# Patient Record
Sex: Male | Born: 1970 | Race: White | Hispanic: No | Smoking: Former smoker
Health system: Southern US, Community
[De-identification: ages and names within clinical notes are randomized; demographics above are authoritative.]

## PROBLEM LIST (undated history)

## (undated) DIAGNOSIS — R531 Weakness: Secondary | ICD-10-CM

## (undated) DIAGNOSIS — K219 Gastro-esophageal reflux disease without esophagitis: Secondary | ICD-10-CM

## (undated) DIAGNOSIS — G2581 Restless legs syndrome: Secondary | ICD-10-CM

## (undated) DIAGNOSIS — I10 Essential (primary) hypertension: Secondary | ICD-10-CM

## (undated) DIAGNOSIS — G4730 Sleep apnea, unspecified: Secondary | ICD-10-CM

## (undated) DIAGNOSIS — R51 Headache: Secondary | ICD-10-CM

## (undated) DIAGNOSIS — F419 Anxiety disorder, unspecified: Secondary | ICD-10-CM

## (undated) DIAGNOSIS — F32A Depression, unspecified: Secondary | ICD-10-CM

## (undated) DIAGNOSIS — F329 Major depressive disorder, single episode, unspecified: Secondary | ICD-10-CM

## (undated) DIAGNOSIS — N400 Enlarged prostate without lower urinary tract symptoms: Secondary | ICD-10-CM

## (undated) DIAGNOSIS — G709 Myoneural disorder, unspecified: Secondary | ICD-10-CM

## (undated) DIAGNOSIS — M549 Dorsalgia, unspecified: Secondary | ICD-10-CM

## (undated) DIAGNOSIS — G4700 Insomnia, unspecified: Secondary | ICD-10-CM

## (undated) HISTORY — PX: NASAL SINUS SURGERY: SHX719

## (undated) HISTORY — DX: Sleep apnea, unspecified: G47.30

## (undated) HISTORY — PX: SPINE SURGERY: SHX786

## (undated) HISTORY — PX: CHOLECYSTECTOMY: SHX55

## (undated) HISTORY — DX: Myoneural disorder, unspecified: G70.9

## (undated) HISTORY — PX: BACK SURGERY: SHX140

## (undated) HISTORY — DX: Dorsalgia, unspecified: M54.9

## (undated) HISTORY — DX: Insomnia, unspecified: G47.00

## (undated) HISTORY — PX: FINGER FRACTURE SURGERY: SHX638

---

## 2002-08-13 ENCOUNTER — Emergency Department (HOSPITAL_COMMUNITY): Admission: EM | Admit: 2002-08-13 | Discharge: 2002-08-13 | Payer: Self-pay | Admitting: *Deleted

## 2002-08-13 ENCOUNTER — Encounter: Payer: Self-pay | Admitting: Emergency Medicine

## 2003-05-25 ENCOUNTER — Encounter (INDEPENDENT_AMBULATORY_CARE_PROVIDER_SITE_OTHER): Payer: Self-pay | Admitting: *Deleted

## 2003-05-25 ENCOUNTER — Ambulatory Visit (HOSPITAL_COMMUNITY): Admission: RE | Admit: 2003-05-25 | Discharge: 2003-05-25 | Payer: Self-pay | Admitting: Otolaryngology

## 2003-05-25 ENCOUNTER — Ambulatory Visit (HOSPITAL_BASED_OUTPATIENT_CLINIC_OR_DEPARTMENT_OTHER): Admission: RE | Admit: 2003-05-25 | Discharge: 2003-05-25 | Payer: Self-pay | Admitting: Otolaryngology

## 2004-10-10 ENCOUNTER — Encounter: Admission: RE | Admit: 2004-10-10 | Discharge: 2004-10-10 | Payer: Self-pay | Admitting: Family Medicine

## 2004-11-27 ENCOUNTER — Encounter (INDEPENDENT_AMBULATORY_CARE_PROVIDER_SITE_OTHER): Payer: Self-pay | Admitting: *Deleted

## 2004-11-27 ENCOUNTER — Ambulatory Visit (HOSPITAL_COMMUNITY): Admission: RE | Admit: 2004-11-27 | Discharge: 2004-11-27 | Payer: Self-pay | Admitting: General Surgery

## 2008-11-11 ENCOUNTER — Emergency Department (HOSPITAL_COMMUNITY): Admission: EM | Admit: 2008-11-11 | Discharge: 2008-11-11 | Payer: Self-pay | Admitting: Emergency Medicine

## 2008-11-12 ENCOUNTER — Ambulatory Visit (HOSPITAL_COMMUNITY): Admission: RE | Admit: 2008-11-12 | Discharge: 2008-11-12 | Payer: Self-pay | Admitting: Internal Medicine

## 2008-11-29 ENCOUNTER — Ambulatory Visit (HOSPITAL_COMMUNITY): Admission: RE | Admit: 2008-11-29 | Discharge: 2008-11-30 | Payer: Self-pay | Admitting: Neurological Surgery

## 2009-08-15 ENCOUNTER — Emergency Department: Payer: Self-pay | Admitting: Emergency Medicine

## 2010-09-11 LAB — CBC
HCT: 48.8 % (ref 39.0–52.0)
Platelets: 205 10*3/uL (ref 150–400)
RDW: 13 % (ref 11.5–15.5)
WBC: 10.4 10*3/uL (ref 4.0–10.5)

## 2010-10-17 NOTE — Op Note (Signed)
[name], [name]              ACCOUNT NO.:  [id number]   MEDICAL RECORD NO.:  [id number]          PATIENT TYPE:  OIB   LOCATION:  3534                         FACILITY:  [facility]   PHYSICIAN:  Stefani Dama, M.D.  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   PROCEDURE:  METRx microdiskectomy L5-S1 right with operating microscope,  microdissection technique.   SURGEON:  Stefani Dama, MD   FIRST ASSISTANT:  Danae Orleans. Venetia Maxon, MD   ANESTHESIA:  General endotracheal.   INDICATIONS:  Chad Foster is a 40 year old individual who has had  significant and severe right buttock and leg pain for the past 2 months'  time.  Despite efforts at conservative management with the passage of  time, the pain has continued to worsen.  He has a large extruded  fragment of disk at L5-S1 on the right side.  After being seen last  week, surgery was discussed and the patient is now being taken to the  operating room to undergo microendoscopic diskectomy using METRx  technique.   PROCEDURE:  The patient was brought to the operating room supine on the  stretcher.  After smooth induction of general endotracheal anesthesia,  he was turned prone.  The back was prepped with alcohol and DuraPrep and  draped in sterile fashion.  Fluoroscopic guidance was used to localize  the laminar arch and the interlaminar space at L5-S1 on the right side.  A K-wire was passed down to the laminar arch of L5 and then using a  winding technique, a series of dilators was passed over this after a  small incision was created in the skin.  The skin had been infiltrated  with 10 mL of lidocaine mixed 50:50 with 0.5% Marcaine.  Once all the  dilation had been performed, an 18-mm wide cannula measuring 5 cm in  depth was placed down into the interlaminar space on the right side of  the L5-S1 space.  The inner tubes were removed and the operating  microscope was brought into the field.  Through this aperture then the  superficial fascia and muscle overlying the interlaminar space was  cleared with a monopolar cautery and an inferior margin lamina of L5 out  the medial wall of facet was taken down using a high-speed drill with  2.2 mm dissecting tool.  As dissection continued the yellow ligament was  taken up and the underlying dural tube was identified.  Then by working  on the lateral aspect of the dural tube, there was noted to be a  substantial mass just ventral to it that was bowing the takeoff of the  S1 nerve root significantly.  Gently using a microdissection technique,  Dr. Venetia Maxon could retract the dural tube while I worked on the lateral  aspect and underneath the nerve root carefully protecting it,  identifying ultimately a  large fragment of disk, this was removed as a  singular piece and then 2 other smaller fragments of disk were  identified also.  Further exploration yielded some small bits of disk  material; however, with the dissection being directly over the disk  space, the disk itself had no obvious tears in the posterior  longitudinal ligament.  Further exploration yielded that there was good  decompression of S1 nerve root, some epidural venous bleeding, which was  controlled with bipolar cautery, but no other fragments of disk.  With  this the  procedure was completed by irrigating the area copiously with antibiotic  irrigating solution and then removing the tube and closing the fascia  with a singular 3-0 Vicryl suture and the subcuticular skin with 3-0  Vicryl suture.  The patient tolerated the procedure well, was returned  to recovery room in stable condition.      Stefani Dama, M.D.   Electronically Signed     HJE/MEDQ  D:  11/29/2008  T:  11/30/2008  Job:  [id number]

## 2010-10-20 NOTE — H&P (Signed)
NAME:  Chad Foster, Chad Foster                        ACCOUNT NO.:  [id number]   MEDICAL RECORD NO.:  [id number]                   PATIENT TYPE:  AMB   LOCATION:  DSC                                  FACILITY:  [facility]   PHYSICIAN:  Hermelinda Medicus, M.D.                DATE OF BIRTH:  September 24, 1970   DATE OF ADMISSION:  05/25/2003  DATE OF DISCHARGE:                                HISTORY & PHYSICAL   HISTORY OF PRESENT ILLNESS:  This patient is a 40 year old male who has had  persistent sinus difficulties in the past.  He is working in a [employer] where he is breathing a considerable amount of airborne plastic  dust, and he has had considerable problems for several years.  He has been  on Augmentin XR.  He has been on Cipro.  He has used Afrin.  Used other  nasal sprays trying to improve his nasal airway, but not so much because he  has so much of a septal deviation, it is moderately deviated, but because of  turbinate hypertrophy.  He continues to have sinus problems, as well as ear  problems and serous otitis, and responded reasonably poorly under care with  Allegra D, Z-Pak, and still has sinus pressure especially in his maxillary  and frontal region.  After treatment, a CAT scan was carried out showing an  acute pansinusitis.  Primarily, his frontal on the right with ethmoid on the  right, both maxillaries, and both sphenoid sinuses are showing fluid level  or almost complete opacification.  With this repetitive and failed  treatment, he now enters for functional endoscopic sinus surgery of these  sinuses.   PAST MEDICAL HISTORY:  Quite unremarkable.  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   MEDICATIONS:  His only medication is Paxil.   REVIEW OF SYSTEMS:  Cardiac, pulmonary, neuro all within normal limits.  He  does show a considerable amount of pain especially in the left side of his  face with pressure-type symptoms.   ALLERGIES:  No known drug  allergies.   PHYSICAL EXAMINATION:  VITAL SIGNS:  His blood pressure is 139/97, pulse is  101, temperature 97.3, weight 108.  HEENT:  His ears are clear.  The tympanic membranes are clear, as are the  external ear canals.  The nose shows congestion of the turbinates.  Septum  shows a mild septal deviation to the left.  There is some drainage even on  antibiotics with last of which was Zithromax, and he continues to have post-  nasal drainage of a cloudy type material.  Larynx is clear.  True cords,  false cords, epiglottis, base of tongue are clear of any ulceration or mass.  True cord mobility, gag reflex, __________, EOMs, and facial nerve are all  symmetrical.  Oral cavity is clear.  No ulceration or mass.  NECK:  Free of any  thyromegaly, cervical adenopathy, or mass.  CHEST:  Clear, no wheezes, rhonchi, or rales.  CARDIOVASCULAR:  No murmurs, thrills, heaves, or gallops.  ABDOMEN:  Unremarkable.  EXTREMITIES:  Unremarkable except for the previous finger trauma.   INITIAL DIAGNOSES:  1. Right frontal ethmoid, bilateral maxillary, bilateral sphenoid sinusitis     with turbinate hypertrophy.  2. 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                                                Hermelinda Medicus, M.D.    JC/MEDQ  D:  05/25/2003  T:  05/25/2003  Job:  [id number]   cc:   Vale Haven. Andrey Campanile, M.D.  [address]  [place]  [place] [address]  Fax: [phone]

## 2010-10-20 NOTE — Op Note (Signed)
NAMEPASCO, MARCHITTO                        ACCOUNT NO.:  [id number]   MEDICAL RECORD NO.:  [id number]                   PATIENT TYPE:  AMB   LOCATION:  DSC                                  FACILITY:  [facility]   PHYSICIAN:  Hermelinda Medicus, M.D.                DATE OF BIRTH:  1970-07-29   DATE OF PROCEDURE:  05/25/2003  DATE OF DISCHARGE:                                 OPERATIVE REPORT   PREOPERATIVE DIAGNOSIS:  Bilateral sphenoid, bilateral maxillary, right  frontal ethmoid sinusitis with turbinate hypertrophy.   POSTOPERATIVE DIAGNOSIS:  Bilateral sphenoid, bilateral maxillary, right  frontal ethmoid sinusitis with turbinate hypertrophy.   PROCEDURE:  Functional endoscopic sinus surgery, bilateral sphenoidotomy,  bilateral maxillary sinus ostial enlargement with left antrostomy, right  frontal ethmoidectomy, reduction of turbinates, culture of left maxillary  and left sphenoid sinus anaerobic and aerobic.   SURGEON:  Hermelinda Medicus, M.D.   ANESTHESIA:  Local with MAC standby with Germaine Pomfret, M.D.   DESCRIPTION OF PROCEDURE:  The patient was placed in the supine position.  Under local anesthesia, was prepped and draped in the usual sterile fashion  using the usual head drape and the usual facial prep.  The patient's  anesthesia was instilled using 200 mg of topical cocaine and the 1%  Xylocaine with epinephrine was injected also bilaterally.  Once the  anesthesia was instilled, the sphenoid sinuses were noted immediately where  we had some purulent drainage draining from both the right and the left  sinus, the left seemingly more severe.  We followed this drainage using the  0 degree scope and then opened the sphenoid sinus natural ostium to three to  four times its usual size, suctioned the sphenoid sinus, cultured this for  anaerobic and aerobic, and then moved to the right side.  Again using the  scope, we located this drainage point, located the natural ostium, used  the  angled [employer] and increased the size approximately three to four  times.  Again, we suctioned the sinus without traumatizing the sinus mucous  membrane or the natural ostium.  With this completed, we then approached the  left maxillary sinus which is where we found the natural ostium. We  suctioned a considerable amount of pus from the sinus.  There was so much  debris and polypoid change.  We removed the polypoid debris from this left  side and then did an antrostomy after reducing the inferior turbinate.  We  did the antrostomy, removed thickened membrane, and polypoid material and  cystic material from the sinus from this approach also, suctioned the sinus  completely as well and made sure it was well ventilated.  The left natural  ostium, we made approximately five times its normal size and we used the 0  and 70 degree scopes in this case.  At that point, the right side was then  approached and the middle turbinate was  pushed medial and the ethmoid sinus  was showing some polypoid change. We then entered this ethmoid sinus using  the 0 degree scope and the upbiting and straight [employer] and  removed a considerable amount of mucopurulent debris as well as cystic  debris as well as polypoid debris from this ethmoid sinus.  We then could  see the ceiling of the sinus. We then could look with a 70 degree scope up  toward the frontal sinus and we could see the natural frontal sinus ostium  up into the sinus and we suctioned the material that was blocking this  sinus.  The most superior part of the frontal sinus was in better condition.  Once the ethmoid sinus was cleared, we then approached the maxillary sinus  again using the 0 and 70 degree scope and the curved suction finding the  natural ostium.  Sidebiter forceps was used to increase this approximately  five times its normal size and then suctioned a considerable amount of  debris from this sinus also, most of  which was mucoid in nature.  Once this  was completed, we suctioned the oral cavity, posterior nasopharynx, checked  all the sinuses, and then placed Gelfilm in the ethmoid on the right to push  the medial middle turbinates medial on both sides, and then placed a  Maricell pack on both sides using a 10 cm pack.  The patient tolerated the  procedure very well.  His vision is excellent.  No evidence of any  postoperative difficulty.  The patient had no undue pain and was reasonably  comfortable and will be seen following tomorrow, then one week, three weeks,  six weeks, six months, and one year.                                               Hermelinda Medicus, M.D.    JC/MEDQ  D:  05/25/2003  T:  05/25/2003  Job:  [id number]   cc:   Vale Haven. Andrey Campanile, M.D.  [address]  [place]  [place] [address]  Fax: [phone]

## 2010-10-20 NOTE — Op Note (Signed)
Foster, Chad              ACCOUNT NO.:  [id number]   MEDICAL RECORD NO.:  [id number]          PATIENT TYPE:  AMB   LOCATION:  DAY                          FACILITY:  [facility]   PHYSICIAN:  Ollen Gross. Vernell Morgans, M.D. DATE OF BIRTH:  01-22-1971   DATE OF PROCEDURE:  11/27/2004  DATE OF DISCHARGE:                                 OPERATIVE REPORT   PREOPERATIVE DIAGNOSIS:  Gallstones.   POSTOPERATIVE DIAGNOSIS:  Gallstones.   PROCEDURE:  Laparoscopic cholecystectomy with intraoperative cholangiogram.   SURGEON:  Ollen Gross. Carolynne Edouard, M.D.   ASSISTANT:  Anselm Pancoast. Zachery Dakins, M.D.   ANESTHESIA:  General endotracheal.   DESCRIPTION OF PROCEDURE:  After informed consent was obtained, the patient  was brought to the operating room, placed in a supine position on the table.  After adequate induction of general anesthesia, the patient's abdomen was  prepped with Betadine and draped in the usual sterile manner. The area below  the umbilicus was infiltrated with 0.25% Marcaine. A small incision was made  with a 15 blade knife. This incision was carried down through the  subcutaneous tissues bluntly with a hemostat and [employer] retractors until  the linea alba was identified. The linea alba was incised with a 15 blade  knife and each side was grasped with Kocher clamps and elevated anteriorly.  The preperitoneal space was then probed bluntly with a hemostat until the  peritoneum was opened and access was gained to the abdominal cavity.  A #0  Vicryl pursestring stitch was placed in the fascia surrounding the opening,  a Hasson cannula was placed through the opening and anchored in place with  the previously placed Vicryl pursestring stitch. The abdomen was then  insufflated with carbon dioxide without difficulty. The patient was placed  in the head-up position and rotated slightly with the right side up. Next  the laparoscope was inserted through the Hasson cannula and the right upper  quadrant  was inspected. The dome of the gallbladder and liver were readily  identified. Next, the epigastric region was infiltrated with 0.25% Marcaine,  a small incision was made with a 15 blade knife and a 10 mm port was placed  bluntly through this incision into the abdominal cavity under direct vision.  Sites were then chosen laterally on the right side of the abdomen for  placement of 5 mm ports. Each of these areas was infiltrated with 0.25%  Marcaine. Small stab incisions were made with a 15 blade knife and 5 mm  ports were placed bluntly through these incisions into the abdominal cavity  under direct vision. A blunt grasper was placed through the lateral most 5  mm port and used to grasp the dome of the gallbladder and elevate it  anteriorly and superiorly. Another blunt grasper was placed through the  other 5 mm port and used to retract on the body and neck of the gallbladder.  A dissector was placed through the epigastric port and using the  electrocautery the peritoneal reflection at the gallbladder neck was opened.  Blunt dissection was then carried out in this area until the gallbladder  neck cystic duct junction was readily identified and a good window was  created. A single clip was placed on the gallbladder neck, a small ductotomy  was made just below the clip with the laparoscopic scissors. A 14 gauge  Angiocath was then placed percutaneously through the anterior abdominal wall  under direct vision. The Reddick cholangiogram catheter was placed through  the Angiocath and flushed. The Reddick catheter was then placed within the  cystic duct and anchored in place with a clip. A cholangiogram was obtained  that showed no filling defects, good length on the cystic duct and good  emptying into the duodenum. The anchoring clip and catheters were then  removed from the patient. Three clips were placed proximally on the cystic  duct and the duct was divided between the two sets of clips.  Posterior to  this, the cystic artery was identified and again dissected bluntly in a  circumferential manner until a good window was created. Two clips were  placed proximally and one distally on the artery and the artery was divided  between the two. Next a laparoscopic hook cautery device was used to  separate the gallbladder from the liver bed. Prior to completely detaching  the gallbladder from the liver bed liver, the liver bed was inspected and  several small bleeding points were coagulated with the electrocautery until  the area was completely hemostatic. The gallbladder was then detached the  rest of the way from the liver bed without difficulty. A laparoscopic bag  was placed through the epigastric port, the gallbladder was placed within  the bag and the bag was sealed. The abdomen was then irrigated with copious  amounts of saline until the effluent was clear. The laparoscope was then  moved to the epigastric port and a gallbladder grasper was placed through  the Hasson cannula and used to grasp the opening of the bag. The bag with  the gallbladder was then removed through the infraumbilical port without  difficulty. The fascial defect was closed with the previous previously  placed Vicryl pursestring stitch as well as with another interrupted #0  Vicryl stitch. The rest of the ports were then removed under direct vision  and were all found to be hemostatic. The gas was allowed to escape. The skin  incisions were closed with interrupted 4-0 Monocryl subcuticular stitches.  Benzoin and Steri-Strips were applied. The patient tolerated the procedure  well. At the end of the case, all needle, sponge and instrument counts were  correct. The patient was then awakened and taken to the recovery in stable  condition.       PST/MEDQ  D:  11/28/2004  T:  11/28/2004  Job:  [id number]

## 2011-02-21 ENCOUNTER — Emergency Department (HOSPITAL_COMMUNITY)
Admission: EM | Admit: 2011-02-21 | Discharge: 2011-02-21 | Disposition: A | Discharge status: Home / Self Care | Payer: Commercial Managed Care - PPO | Attending: Emergency Medicine | Admitting: Emergency Medicine

## 2011-02-21 ENCOUNTER — Encounter: Payer: Self-pay | Admitting: *Deleted

## 2011-02-21 ENCOUNTER — Emergency Department (HOSPITAL_COMMUNITY): Payer: Commercial Managed Care - PPO

## 2011-02-21 DIAGNOSIS — I10 Essential (primary) hypertension: Secondary | ICD-10-CM | POA: Insufficient documentation

## 2011-02-21 DIAGNOSIS — F3289 Other specified depressive episodes: Secondary | ICD-10-CM | POA: Insufficient documentation

## 2011-02-21 DIAGNOSIS — M79609 Pain in unspecified limb: Secondary | ICD-10-CM | POA: Insufficient documentation

## 2011-02-21 DIAGNOSIS — F17200 Nicotine dependence, unspecified, uncomplicated: Secondary | ICD-10-CM | POA: Insufficient documentation

## 2011-02-21 DIAGNOSIS — F329 Major depressive disorder, single episode, unspecified: Secondary | ICD-10-CM | POA: Insufficient documentation

## 2011-02-21 DIAGNOSIS — M79671 Pain in right foot: Secondary | ICD-10-CM

## 2011-02-21 DIAGNOSIS — F411 Generalized anxiety disorder: Secondary | ICD-10-CM | POA: Insufficient documentation

## 2011-02-21 HISTORY — DX: Major depressive disorder, single episode, unspecified: F32.9

## 2011-02-21 HISTORY — DX: Anxiety disorder, unspecified: F41.9

## 2011-02-21 HISTORY — DX: Depression, unspecified: F32.A

## 2011-02-21 HISTORY — DX: Essential (primary) hypertension: I10

## 2011-02-21 MED ORDER — ONDANSETRON 8 MG PO TBDP
8.0000 mg | ORAL_TABLET | Freq: Once | ORAL | Status: AC
  Administered 2011-02-21: 8 mg via ORAL
  Filled 2011-02-21: qty 1

## 2011-02-21 MED ORDER — OXYCODONE-ACETAMINOPHEN 5-325 MG PO TABS: ORAL_TABLET | ORAL | Status: DC

## 2011-02-21 MED ORDER — CELECOXIB 200 MG PO CAPS: 200.0000 mg | ORAL_CAPSULE | Freq: Two times a day (BID) | ORAL | Status: AC

## 2011-02-21 MED ORDER — HYDROMORPHONE HCL 1 MG/ML IJ SOLN
1.0000 mg | Freq: Once | INTRAMUSCULAR | Status: AC
  Administered 2011-02-21: 1 mg via INTRAMUSCULAR
  Filled 2011-02-21: qty 1
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
## 2011-02-21 NOTE — ED Notes (Signed)
Person(male) with pt advised me that the pt needed pain medications and that Morphine was not going to work.  Individual with pt is very rude.

## 2011-02-21 NOTE — ED Notes (Signed)
Family at bedside. Patient states that he needs pain medicine what was given did not help him at all. RN Leonette Most aware.

## 2011-02-21 NOTE — ED Notes (Signed)
Pt fitted for crutches and fx shoe, pt given crutch walking instruction and verbalized an understanding of instructions.

## 2011-03-02 NOTE — ED Provider Notes (Signed)
Medical screening examination/treatment/procedure(s) were performed by non-physician practitioner and as supervising physician I was immediately available for consultation/collaboration.   Geoffery Lyons, MD 03/02/11 [id number]

## 2011-12-31 ENCOUNTER — Ambulatory Visit (INDEPENDENT_AMBULATORY_CARE_PROVIDER_SITE_OTHER): Payer: Commercial Managed Care - PPO | Admitting: Psychology

## 2011-12-31 DIAGNOSIS — F329 Major depressive disorder, single episode, unspecified: Secondary | ICD-10-CM

## 2012-01-22 ENCOUNTER — Ambulatory Visit (INDEPENDENT_AMBULATORY_CARE_PROVIDER_SITE_OTHER): Payer: Commercial Managed Care - PPO | Admitting: Psychology

## 2012-01-22 DIAGNOSIS — F329 Major depressive disorder, single episode, unspecified: Secondary | ICD-10-CM

## 2012-02-05 ENCOUNTER — Encounter (HOSPITAL_COMMUNITY): Payer: Self-pay | Admitting: Psychology

## 2012-02-05 ENCOUNTER — Ambulatory Visit (HOSPITAL_COMMUNITY): Payer: Self-pay | Admitting: Psychology

## 2012-02-05 NOTE — Progress Notes (Signed)
Patient:  Chad Foster   DOB: [birth year]  MR Number: [id number]  Location: BEHAVIORAL [facility] [address] [place] [address] Dept: [phone]  Start: 1 PM End: 2 PM  Provider/Observer:     Hershal Coria PSYD  Chief Complaint:      Chief Complaint  Patient presents with  . Depression  . Anxiety  . Stress    Reason For Service:     The patient was referred by Dr. Andrey Campanile because of issues related to depression after being out of work due to severe back pain. The patient reports that he had disc rupture and subsequent surgery in L5/S1. The patient recuperated from this for the most part and try to go back to work. He began developing more and more pain and saw Dr. Danielle Dess again. He has been having increasing pain difficulties and has missed a lot of work and been limited as far as his physical activity. He has also received spinal injections as well. The patient reports that there is no other surgery possibility. The patient has had issues with depression to some degree of the past 10 years and has been treated with both Paxil and Effexor in the past. The patient did have a time where he stopped taking these for one week and had a significant setback from his depression symptoms in his back to 1950 mg of SSRI.   Interventions Strategy:  Cognitive/behavioral psychotherapeutic interventions  Participation Level:   Active  Participation Quality:  Appropriate      Behavioral Observation:  Well Groomed, Alert, and Appropriate.   Current Psychosocial Factors: The patient reports he is continuing to struggle significantly with not been able to work adequately and the limitations to his life. Significant symptoms of depression including feelings of helplessness and hopelessness continued to negative bili impact on his interactions with his family.  Content of Session:   Reviewed current symptoms and  continue to work on building better coping skills and strategies around issues of his symptoms of depression.  Current Status:   The patient continues to report significant symptoms of depression including feelings of helplessness/hopelessness, sadness, social withdrawal, changes in appetite and sleep, as well as significant and recurrent severe pain.  Patient Progress:   Stable  Goals Addressed Today:    Goals addressed today have to do with increasing his coping skills and strategies around issues of recurrent depression and better dealing with when he is currently going through.  Impression/Diagnosis:   At this point, the patient does have some significant residual pain problems from his ruptured disc. He is having difficulty maintaining his work and is struggling financially and dealing with not being able to work like he had been. At this point, there does appear to be significant symptoms of depression.   Diagnosis:    Axis I:  1. Depression         Axis II: No diagnosis
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
## 2012-02-15 ENCOUNTER — Ambulatory Visit (INDEPENDENT_AMBULATORY_CARE_PROVIDER_SITE_OTHER): Payer: Commercial Managed Care - PPO | Admitting: Psychology

## 2012-02-15 DIAGNOSIS — F3289 Other specified depressive episodes: Secondary | ICD-10-CM

## 2012-02-15 DIAGNOSIS — F329 Major depressive disorder, single episode, unspecified: Secondary | ICD-10-CM

## 2012-02-15 NOTE — Progress Notes (Signed)
Patient:  Chad Foster   DOB: [birth year]  MR Number: [id number]  Location: BEHAVIORAL [facility] [address] [place] [address] Dept: [phone]  Start: 1 PM End: 2 PM  Provider/Observer:     Hershal Coria PSYD  Chief Complaint:      Chief Complaint  Patient presents with  . Depression  . Stress  . Other    Significant pain symptoms    Reason For Service:     The patient was referred by Dr. Andrey Campanile because of issues related to depression after being out of work due to severe back pain. The patient reports that he had disc rupture and subsequent surgery in L5/S1. The patient recuperated from this for the most part and try to go back to work. He began developing more and more pain and saw Dr. Danielle Dess again. He has been having increasing pain difficulties and has missed a lot of work and been limited as far as his physical activity. He has also received spinal injections as well. The patient reports that there is no other surgery possibility. The patient has had issues with depression to some degree of the past 10 years and has been treated with both Paxil and Effexor in the past. The patient did have a time where he stopped taking these for one week and had a significant setback from his depression symptoms and is back taking the SSRI medications again.    Interventions Strategy:  Cognitive/behavioral psychotherapeutic interventions  Participation Level:   Active  Participation Quality:  Appropriate      Behavioral Observation:  Well Groomed, Alert, and Appropriate.   Current Psychosocial Factors: The patient describes continued family stress and difficulty that are causing him a great deal of stress. The patient reports that his wife has told him that she was told by one of her coworkers who is a physician that she may be pregnant even though she had a hysterectomy years ago.  This created some  conflict as he is not feeling capable alert ready to deal with his wife being pregnant and having another child. He has not been able to work and is overwhelmed with all of how he would handle been unable to take care of this child.  Content of Session:   Reviewed current symptoms and continue to work on building better coping skills and strategies around issues of his symptoms of depression.  Current Status:   The patient continues to report significant symptoms of depression including feelings of helplessness/hopelessness, sadness, social withdrawal, changes in appetite and sleep, as well as significant and recurrent severe pain.  Patient Progress:   Stable  Goals Addressed Today:    Goals addressed today have to do with increasing his coping skills and strategies around issues of recurrent depression and better dealing with when he is currently going through.  Impression/Diagnosis:   At this point, the patient does have some significant residual pain problems from his ruptured disc. He is having difficulty maintaining his work and is struggling financially and dealing with not being able to work like he had been. At this point, there does appear to be significant symptoms of depression.   Diagnosis:    Axis I:  1. Depression         Axis II: No diagnosis

## 2012-02-29 ENCOUNTER — Ambulatory Visit (INDEPENDENT_AMBULATORY_CARE_PROVIDER_SITE_OTHER): Payer: Commercial Managed Care - PPO | Admitting: Psychology

## 2012-02-29 DIAGNOSIS — F411 Generalized anxiety disorder: Secondary | ICD-10-CM

## 2012-02-29 DIAGNOSIS — F419 Anxiety disorder, unspecified: Secondary | ICD-10-CM

## 2012-02-29 DIAGNOSIS — F329 Major depressive disorder, single episode, unspecified: Secondary | ICD-10-CM

## 2012-03-04 ENCOUNTER — Encounter (HOSPITAL_COMMUNITY): Payer: Self-pay | Admitting: Psychology

## 2012-03-04 NOTE — Progress Notes (Signed)
Patient:  Chad Foster   DOB: 08-06-1970  MR Number: [id number]  Location: BEHAVIORAL [facility] [address] [place] [address] Dept: [phone]  Start: 1 PM End: 2 PM  Provider/Observer:     Hershal Coria PSYD  Chief Complaint:      Chief Complaint  Patient presents with  . Depression  . Anxiety    Reason For Service:     The patient was referred by Dr. Andrey Campanile because of issues related to depression after being out of work due to severe back pain. The patient reports that he had disc rupture and subsequent surgery in L5/S1. The patient recuperated from this for the most part and try to go back to work. He began developing more and more pain and saw Dr. Danielle Dess again. He has been having increasing pain difficulties and has missed a lot of work and been limited as far as his physical activity. He has also received spinal injections as well. The patient reports that there is no other surgery possibility. The patient has had issues with depression to some degree of the past 10 years and has been treated with both Paxil and Effexor in the past. The patient did have a time where he stopped taking these for one week and had a significant setback from his depression symptoms and is back taking the SSRI medications again.    Interventions Strategy:  Cognitive/behavioral psychotherapeutic interventions  Participation Level:   Active  Participation Quality:  Appropriate      Behavioral Observation:  Well Groomed, Alert, and Appropriate.   Current Psychosocial Factors: The patient reports that the situation with his wife's possible pregnancy came and went without and fair and there are no were concerns about whether or not she is pregnant. We did use this as an example to work on Pharmacologist and strategies..  Content of Session:   Reviewed current symptoms and continue to work on building better coping  skills and strategies around issues of his symptoms of depression.  Current Status:   The patient continues to report significant symptoms of depression including feelings of helplessness/hopelessness, sadness, social withdrawal, changes in appetite and sleep, as well as significant and recurrent severe pain.  Patient Progress:   Stable  Goals Addressed Today:    Goals addressed today have to do with increasing his coping skills and strategies around issues of recurrent depression and better dealing with when he is currently going through.  Impression/Diagnosis:   At this point, the patient does have some significant residual pain problems from his ruptured disc. He is having difficulty maintaining his work and is struggling financially and dealing with not being able to work like he had been. At this point, there does appear to be significant symptoms of depression.   Diagnosis:    Axis I:  1. Depressive disorder   2. Anxiety         Axis II: No diagnosis

## 2012-04-02 ENCOUNTER — Ambulatory Visit (HOSPITAL_COMMUNITY): Payer: Self-pay | Admitting: Psychology

## 2012-04-08 ENCOUNTER — Ambulatory Visit (HOSPITAL_COMMUNITY): Payer: Self-pay | Admitting: Psychology

## 2012-04-10 ENCOUNTER — Ambulatory Visit (HOSPITAL_COMMUNITY): Payer: Self-pay | Admitting: Psychology

## 2012-04-16 ENCOUNTER — Ambulatory Visit (HOSPITAL_COMMUNITY): Payer: Self-pay | Admitting: Psychology

## 2012-04-28 ENCOUNTER — Ambulatory Visit (INDEPENDENT_AMBULATORY_CARE_PROVIDER_SITE_OTHER): Payer: Commercial Managed Care - PPO | Admitting: Psychology

## 2012-04-28 DIAGNOSIS — F411 Generalized anxiety disorder: Secondary | ICD-10-CM

## 2012-04-28 DIAGNOSIS — F3289 Other specified depressive episodes: Secondary | ICD-10-CM

## 2012-04-28 DIAGNOSIS — F329 Major depressive disorder, single episode, unspecified: Secondary | ICD-10-CM

## 2012-04-28 DIAGNOSIS — F419 Anxiety disorder, unspecified: Secondary | ICD-10-CM

## 2012-04-30 ENCOUNTER — Ambulatory Visit (HOSPITAL_COMMUNITY)
Admission: RE | Admit: 2012-04-30 | Discharge: 2012-04-30 | Disposition: A | Discharge status: Home / Self Care | Payer: 59 | Source: Ambulatory Visit | Attending: Anesthesiology | Admitting: Anesthesiology

## 2012-04-30 DIAGNOSIS — M545 Low back pain, unspecified: Secondary | ICD-10-CM | POA: Insufficient documentation

## 2012-04-30 DIAGNOSIS — IMO0001 Reserved for inherently not codable concepts without codable children: Secondary | ICD-10-CM | POA: Insufficient documentation
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
## 2012-05-06 ENCOUNTER — Ambulatory Visit (HOSPITAL_COMMUNITY): Payer: Self-pay | Admitting: Physical Therapy

## 2012-05-09 ENCOUNTER — Inpatient Hospital Stay (HOSPITAL_COMMUNITY): Admission: RE | Admit: 2012-05-09 | Payer: Self-pay | Source: Ambulatory Visit

## 2012-05-13 ENCOUNTER — Ambulatory Visit (HOSPITAL_COMMUNITY)
Admission: RE | Admit: 2012-05-13 | Discharge: 2012-05-13 | Disposition: A | Discharge status: Home / Self Care | Payer: 59 | Source: Ambulatory Visit | Attending: Anesthesiology | Admitting: Anesthesiology

## 2012-05-13 DIAGNOSIS — M545 Low back pain, unspecified: Secondary | ICD-10-CM | POA: Insufficient documentation

## 2012-05-13 DIAGNOSIS — IMO0001 Reserved for inherently not codable concepts without codable children: Secondary | ICD-10-CM | POA: Insufficient documentation

## 2012-05-13 NOTE — Progress Notes (Signed)
Physical Therapy Treatment Patient Details  Name: Chad Foster MRN: [id number] Date of Birth: 26-Sep-1970  Today's Date: 05/13/2012 Time: 3086-5784 PT Time Calculation (min): 39 min  Visit#: 2  of 12   Re-eval: 05/30/12 Charges: Therex x 38'   Subjective: Symptoms/Limitations Symptoms: Pt reports HEP compliance. Pain Assessment Currently in Pain?: Yes Pain Score:   4 Pain Location: Back Pain Orientation: Lower   Exercise/Treatments Stretches Active Hamstring Stretch: 3 reps;30 seconds Single Knee to Chest Stretch: 3 reps;30 seconds Piriformis Stretch: 3 reps;30 seconds Supine Ab Set: 5 reps AB Set Limitations: 10" holds Bent Knee Raise: 10 reps Bridge: 10 reps;5 seconds Prone  Other Prone Lumbar Exercises: NMR for multifidus 10x5"  Physical Therapy Assessment and Plan PT Assessment and Plan Clinical Impression Statement: Pt completes therex well with good core contraction after cueing for technique. Began LE stretching and postural strengthening. Pt completes scapular tband exercises with good scapular contraction but poor posture requiring vc's to engage core and improve head posture. Pt reports increase leg pain with lumbar extension which subsided over time. Pt reports 4/10 LBP at end of session. PT Plan: Continue to progress core and postural strengthening and LE stretching per PT POC.     Problem List Patient Active Problem List  Diagnosis  . Lumbago    PT - End of Session Activity Tolerance: Patient tolerated treatment well General Behavior During Session: [facility] for tasks performed Cognition: [facility] for tasks performed  Seth Bake, PTA 05/13/2012, 11:49 AM

## 2012-05-15 ENCOUNTER — Ambulatory Visit (HOSPITAL_COMMUNITY): Payer: Self-pay

## 2012-05-15 ENCOUNTER — Encounter (HOSPITAL_COMMUNITY): Payer: Self-pay | Admitting: Psychology

## 2012-05-15 NOTE — Progress Notes (Signed)
Patient:  Chad Foster   DOB: January 02, 1971  MR Number: [id number]  Location: BEHAVIORAL [facility] [address] [place] [address] Dept: [phone]  Start: 2 PM End: 3 PM  Provider/Observer:     Hershal Coria PSYD  Chief Complaint:      Chief Complaint  Patient presents with  . Anxiety  . Depression    Reason For Service:     The patient was referred by Dr. Andrey Campanile because of issues related to depression after being out of work due to severe back pain. The patient reports that he had disc rupture and subsequent surgery in L5/S1. The patient recuperated from this for the most part and try to go back to work. He began developing more and more pain and saw Dr. Danielle Dess again. He has been having increasing pain difficulties and has missed a lot of work and been limited as far as his physical activity. He has also received spinal injections as well. The patient reports that there is no other surgery possibility. The patient has had issues with depression to some degree of the past 10 years and has been treated with both Paxil and Effexor in the past. The patient did have a time where he stopped taking these for one week and had a significant setback from his depression symptoms and is back taking the SSRI medications again.    Interventions Strategy:  Cognitive/behavioral psychotherapeutic interventions  Participation Level:   Active  Participation Quality:  Appropriate      Behavioral Observation:  Well Groomed, Alert, and Appropriate.   Current Psychosocial Factors: The patient reports that he has been working on issues between he and his wife more and has been doing more things as far as attempting to cope with his current situation. Severe pain that is almost constant remains a major issue but he has been trying to do as much physical activity as possible..  Content of Session:   Reviewed current  symptoms and continue to work on building better coping skills and strategies around issues of his symptoms of depression.  Current Status:   The patient continues to report significant symptoms of depression including feelings of helplessness/hopelessness, sadness, social withdrawal, changes in appetite and sleep, as well as significant and recurrent severe pain.  Patient Progress:   Stable  Goals Addressed Today:    Goals addressed today have to do with increasing his coping skills and strategies around issues of recurrent depression and better dealing with when he is currently going through.  Impression/Diagnosis:   At this point, the patient does have some significant residual pain problems from his ruptured disc. He is having difficulty maintaining his work and is struggling financially and dealing with not being able to work like he had been. At this point, there does appear to be significant symptoms of depression.   Diagnosis:    Axis I:  1. Depressive disorder   2. Anxiety         Axis II: No diagnosis

## 2012-05-19 ENCOUNTER — Ambulatory Visit (HOSPITAL_COMMUNITY): Payer: Self-pay | Admitting: Psychology

## 2012-05-20 ENCOUNTER — Ambulatory Visit (HOSPITAL_COMMUNITY): Payer: Self-pay | Admitting: Physical Therapy

## 2012-05-21 ENCOUNTER — Ambulatory Visit (INDEPENDENT_AMBULATORY_CARE_PROVIDER_SITE_OTHER): Payer: 59 | Admitting: Psychology

## 2012-05-21 DIAGNOSIS — F341 Dysthymic disorder: Secondary | ICD-10-CM

## 2012-05-21 DIAGNOSIS — F419 Anxiety disorder, unspecified: Secondary | ICD-10-CM

## 2012-05-21 DIAGNOSIS — F329 Major depressive disorder, single episode, unspecified: Secondary | ICD-10-CM

## 2012-05-22 ENCOUNTER — Ambulatory Visit (HOSPITAL_COMMUNITY)
Admission: RE | Admit: 2012-05-22 | Discharge: 2012-05-22 | Disposition: A | Discharge status: Home / Self Care | Payer: 59 | Source: Ambulatory Visit | Attending: Anesthesiology | Admitting: Anesthesiology

## 2012-05-22 ENCOUNTER — Encounter (HOSPITAL_COMMUNITY): Payer: Self-pay | Admitting: Psychology

## 2012-05-22 NOTE — Progress Notes (Signed)
Physical Therapy Treatment Patient Details  Name: Chad Foster MRN: [id number] Date of Birth: 28-Apr-1971  Today's Date: 05/22/2012 Time: 1430-1508 PT Time Calculation (min): 38 min Charges: 48' TE Visit#: 3  of 12   Re-eval: 05/30/12   Subjective: Symptoms/Limitations Symptoms: he had to cancel apt last time due to increased pain.  states his pain remains the same about a 3-4/10.  Pain Assessment Currently in Pain?: Yes Pain Score:   4  Precautions/Restrictions     Exercise/Treatments Stretches Active Hamstring Stretch: 3 reps;30 seconds (Bilateral) Piriformis Stretch: 3 reps;30 seconds Standing Heel Raises: 10 reps Functional Squats: 10 reps Scapular Retraction: 10 reps;Theraband Theraband Level (Scapular Retraction): Level 4 (Blue) Row: 10 reps;Theraband Theraband Level (Row): Level 4 (Blue) Shoulder Extension: 10 reps;Theraband Theraband Level (Shoulder Extension): Level 4 (Blue) Shoulder ADduction: Both;10 reps;Theraband Theraband Level (Shoulder Adduction): Level 4 (Blue) Other Standing Lumbar Exercises: Gastroc stretch B 3x30" (slant board) Supine Bent Knee Raise: 15 reps Sidelying Clam: 5 reps (10 sec holds, BLE) Hip Abduction: 10 reps (BLE) Prone  Single Arm Raise: Right;Left;10 reps Straight Leg Raise: 10 reps (BLE) Opposite Arm/Leg Raise: Right arm/Left leg;Left arm/Right leg;5 reps Other Prone Lumbar Exercises: NMR for multifidus and PF 10x10" each Other Prone Lumbar Exercises: B hip IR 2x5  Physical Therapy Assessment and Plan PT Assessment and Plan Clinical Impression Statement: 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  Added LE strengthening activities to help core strength.  Pt continues to improve core coordination and posture. Provided pt w/blue t-band for home use. PT Plan: Continue to progress LE strengthening and core strength.  Add supine clams, dead bug next visit.      Goals    Problem List Patient Active Problem List  Diagnosis  . Lumbago    PT Plan of Care PT Home Exercise Plan: provided pt with blue tband.  Langley Ingalls, PT 05/22/2012, 3:10 PM

## 2012-05-22 NOTE — Progress Notes (Signed)
Patient:  Chad Foster   DOB: [birth year]  MR Number: [id number]  Location: BEHAVIORAL [facility] [address] [place] [address] Dept: [phone]  Start: 2 PM End: 3 PM  Provider/Observer:     Hershal Coria PSYD  Chief Complaint:      Chief Complaint  Patient presents with  . Anxiety  . Depression  . Stress  . Other    Severe chronic pain    Reason For Service:     The patient was referred by Dr. Andrey Campanile because of issues related to depression after being out of work due to severe back pain. The patient reports that he had disc rupture and subsequent surgery in L5/S1. The patient recuperated from this for the most part and try to go back to work. He began developing more and more pain and saw Dr. Danielle Dess again. He has been having increasing pain difficulties and has missed a lot of work and been limited as far as his physical activity. He has also received spinal injections as well. The patient reports that there is no other surgery possibility. The patient has had issues with depression to some degree of the past 10 years and has been treated with both Paxil and Effexor in the past. The patient did have a time where he stopped taking these for one week and had a significant setback from his depression symptoms and is back taking the SSRI medications again.    Interventions Strategy:  Cognitive/behavioral psychotherapeutic interventions  Participation Level:   Active  Participation Quality:  Appropriate      Behavioral Observation:  Well Groomed, Alert, and Appropriate.   Current Psychosocial Factors: The patient reports that he has been able to continue to reduce his pain medications but still is taking. He has seen a pain clinic now in his try to pursue an implanted TENS unit. However, he reports that he has not taken all of the pain medications that have been prescribed to him that there are  times when he does not feel that it is essential that he takes it and is worried about developing any type of dependency. However, because he is been told by his doctors that he needs to "stay ahead of this pain" he is concerned that they will be "mad at him" if he has some of these narcotic pain medications left over the months. I told them that he needs to be honest with them about what is going on and what his concerns are that maybe they can change his prescription to PRN.  Content of Session:   Reviewed current symptoms and continue to work on building better coping skills and strategies around issues of his symptoms of depression.  Current Status:   The patient continues to experience severe pain from nerve root impingement at L5-S1. The patient reports that he is following suggestions to be as physically active as possible but trying to not do more than he could replicate the next day to build up his tolerance.  Patient Progress:   Stable  Goals Addressed Today:    Goals addressed today have to do with increasing his coping skills and strategies around issues of recurrent depression and better dealing with when he is currently going through.  Impression/Diagnosis:   At this point, the patient does have some significant residual pain problems from his ruptured disc. He is having difficulty maintaining his work and is struggling financially and dealing with not  being able to work like he had been. At this point, there does appear to be significant symptoms of depression.   Diagnosis:    Axis I:  1. Depressive disorder   2. Anxiety         Axis II: No diagnosis

## 2012-05-26 ENCOUNTER — Ambulatory Visit (HOSPITAL_COMMUNITY): Payer: Self-pay | Admitting: Physical Therapy

## 2012-05-30 ENCOUNTER — Inpatient Hospital Stay (HOSPITAL_COMMUNITY): Admission: RE | Admit: 2012-05-30 | Payer: Self-pay | Source: Ambulatory Visit | Admitting: Physical Therapy

## 2012-06-19 ENCOUNTER — Encounter (HOSPITAL_COMMUNITY): Payer: Self-pay | Admitting: Pharmacy Technician

## 2012-06-19 NOTE — H&P (Signed)
  NTS SOAP Note  Vital Signs:  Vitals as of: 06/19/2012: Systolic 157: Diastolic 101: Heart Rate 89: Temp 99.21F: Height 40ft 0in: Weight 210Lbs 0 Ounces: Pain Level 2: BMI 28  BMI : 28.48 kg/m2  Subjective: This 42 Years 98 Months old Male presents for of a mass on his chest wall.  Has been present for many years, but recently is increasing in size and causing him discomfort.  Review of Symptoms:  Constitutional:unremarkable   Head:unremarkable    Eyes:  blurred vision bilateral Nose/Mouth/Throat:unremarkable Cardiovascular:  unremarkable   Respiratory:unremarkable   Gastrointestin    heartburn Genitourinary:unremarkable       back pain Skin:unremarkable Hematolgic/Lymphatic:unremarkable     Allergic/Immunologic:unremarkable     Past Medical History:    Reviewed   Past Medical History  Surgical History: back, hip, finger, sinus Medical Problems:  High Blood pressure, chronic back pain Allergies: nkda Medications: dexilant, venlafoxine, xanax, benicar, premipezole, celebrex, oxycodone   Social History:Reviewed  Social History  Preferred Language: English Ethnicity: Not Hispanic / Latino Age: 42 Years 0 Months Marital Status:  M Alcohol: socially Recreational drug(s):  No   Smoking Status: Smoker/Current status unknown reviewed on 06/19/2012 Functional Status reviewed on mm/dd/yyyy ------------------------------------------------ Bathing: Normal Cooking: Normal Dressing: Normal Driving: Normal Eating: Normal Managing Meds: Normal Oral Care: Normal Shopping: Normal Toileting: Normal Transferring: Normal Walking: Normal Cognitive Status reviewed on mm/dd/yyyy ------------------------------------------------ Attention: Normal Decision Making: Normal Language: Normal Memory: Normal Motor: Normal Perception: Normal Problem Solving: Normal Visual and Spatial: Normal   Family History:  Reviewed   Family  History  Is there a family history HY:[id number], dm. HTN    Objective Information: General:  Well appearing, well nourished in no distress.      5cm ovoid, rubbery subcutaneous mass, right midclavicular line, chest wall.  Mobile. Heart:  RRR, no murmur Lungs:    CTA bilaterally, no wheezes, rhonchi, rales.  Breathing unlabored.  Assessment:Neoplasm, chest wall  Diagnosis &amp; Procedure:    Plan:Scheduled for excision of neoplasm, chest wall on 06/23/12.   Patient Education:Alternative treatments to surgery were discussed with patient (and family).  Risks and benefits  of procedure were fully explained to the patient (and family) who gave informed consent. Patient/family questions were addressed.  Follow-up:Pending Surgery

## 2012-06-20 ENCOUNTER — Encounter (HOSPITAL_COMMUNITY): Payer: Self-pay

## 2012-06-20 ENCOUNTER — Other Ambulatory Visit: Payer: Self-pay

## 2012-06-20 ENCOUNTER — Ambulatory Visit (HOSPITAL_COMMUNITY): Payer: Self-pay | Admitting: Psychology

## 2012-06-20 ENCOUNTER — Encounter (HOSPITAL_COMMUNITY)
Admission: RE | Admit: 2012-06-20 | Discharge: 2012-06-20 | Disposition: A | Discharge status: Home / Self Care | Payer: Commercial Managed Care - PPO | Source: Ambulatory Visit | Attending: General Surgery | Admitting: General Surgery

## 2012-06-20 HISTORY — DX: Restless legs syndrome: G25.81

## 2012-06-20 HISTORY — DX: Gastro-esophageal reflux disease without esophagitis: K21.9

## 2012-06-20 LAB — CBC WITH DIFFERENTIAL/PLATELET
Eosinophils Absolute: 0.2 10*3/uL (ref 0.0–0.7)
Hemoglobin: 17.2 g/dL — ABNORMAL HIGH (ref 13.0–17.0)
Lymphocytes Relative: 20 % (ref 12–46)
Lymphs Abs: 1.9 10*3/uL (ref 0.7–4.0)
MCH: 33.6 pg (ref 26.0–34.0)
Monocytes Relative: 7 % (ref 3–12)
Neutro Abs: 6.8 10*3/uL (ref 1.7–7.7)
Neutrophils Relative %: 72 % (ref 43–77)
Platelets: 253 10*3/uL (ref 150–400)
RBC: 5.12 MIL/uL (ref 4.22–5.81)
WBC: 9.5 10*3/uL (ref 4.0–10.5)

## 2012-06-20 LAB — BASIC METABOLIC PANEL
Calcium: 9.6 mg/dL (ref 8.4–10.5)
GFR calc Af Amer: >90 mL/min (ref >90)
GFR calc non Af Amer: >90 mL/min (ref >90)
Glucose, Bld: 126 mg/dL — ABNORMAL HIGH (ref 70–99)
Potassium: 4.4 mEq/L (ref 3.5–5.1)
Sodium: 139 mEq/L (ref 135–145)

## 2012-06-20 LAB — SURGICAL PCR SCREEN: Staphylococcus aureus: NEGATIVE

## 2012-06-20 NOTE — Patient Instructions (Addendum)
    Chad Foster  06/20/2012   Your procedure is scheduled on: 06/23/2012  Report to [facility] at  820  AM.  Call this number if you have problems the morning of surgery: [phone]   Remember:   Do not eat food or drink liquids after midnight.   Take these medicines the morning of surgery with A SIP OF WATER: xanax,celebrex,dexilant,benicar,oxycodone,effexor   Do not wear jewelry, make-up or nail polish.  Do not wear lotions, powders, or perfumes.  Do not shave 48 hours prior to surgery. Men may shave face and neck.  Do not bring valuables to the hospital.  Contacts, dentures or bridgework may not be worn into surgery.  Leave suitcase in the car. After surgery it may be brought to your room.  For patients admitted to the hospital, checkout time is 11:00 AM the day of discharge.   Patients discharged the day of surgery will not be allowed to drive  home.  Name and phone number of your driver: family  Special Instructions: Shower using CHG 2 nights before surgery and the night before surgery.  If you shower the day of surgery use CHG.  Use special wash - you have one bottle of CHG for all showers.  You should use approximately 1/3 of the bottle for each shower.   Please read over the following fact sheets that you were given: Pain Booklet, Coughing and Deep Breathing, MRSA Information, Surgical Site Infection Prevention, Anesthesia Post-op Instructions and Care and Recovery After Surgery PATIENT INSTRUCTIONS POST-ANESTHESIA  IMMEDIATELY FOLLOWING SURGERY:  Do not drive or operate machinery for the first twenty four hours after surgery.  Do not make any important decisions for twenty four hours after surgery or while taking narcotic pain medications or sedatives.  If you develop intractable nausea and vomiting or a severe headache please notify your doctor immediately.  FOLLOW-UP:  Please make an appointment with your surgeon as instructed. You do not need to follow up with anesthesia  unless specifically instructed to do so.  WOUND CARE INSTRUCTIONS (if applicable):  Keep a dry clean dressing on the anesthesia/puncture wound site if there is drainage.  Once the wound has quit draining you may leave it open to air.  Generally you should leave the bandage intact for twenty four hours unless there is drainage.  If the epidural site drains for more than 36-48 hours please call the anesthesia department.  QUESTIONS?:  Please feel free to call your physician or the hospital operator if you have any questions, and they will be happy to assist you.

## 2012-06-23 ENCOUNTER — Encounter (HOSPITAL_COMMUNITY): Payer: Self-pay | Admitting: *Deleted

## 2012-06-23 ENCOUNTER — Ambulatory Visit (HOSPITAL_COMMUNITY)
Admission: RE | Admit: 2012-06-23 | Discharge: 2012-06-23 | Disposition: A | Discharge status: Home / Self Care | Payer: Commercial Managed Care - PPO | Source: Ambulatory Visit | Attending: General Surgery | Admitting: General Surgery

## 2012-06-23 ENCOUNTER — Encounter (HOSPITAL_COMMUNITY)
Admission: RE | Disposition: A | Discharge status: Home / Self Care | Payer: Self-pay | Source: Ambulatory Visit | Attending: General Surgery

## 2012-06-23 ENCOUNTER — Encounter (HOSPITAL_COMMUNITY): Payer: Self-pay | Admitting: Anesthesiology

## 2012-06-23 ENCOUNTER — Ambulatory Visit (HOSPITAL_COMMUNITY): Payer: Commercial Managed Care - PPO | Admitting: Anesthesiology

## 2012-06-23 DIAGNOSIS — Z01810 Encounter for preprocedural cardiovascular examination: Secondary | ICD-10-CM | POA: Insufficient documentation

## 2012-06-23 DIAGNOSIS — D1739 Benign lipomatous neoplasm of skin and subcutaneous tissue of other sites: Secondary | ICD-10-CM | POA: Insufficient documentation

## 2012-06-23 DIAGNOSIS — Z01812 Encounter for preprocedural laboratory examination: Secondary | ICD-10-CM | POA: Insufficient documentation

## 2012-06-23 DIAGNOSIS — I10 Essential (primary) hypertension: Secondary | ICD-10-CM | POA: Insufficient documentation

## 2012-06-23 HISTORY — PX: MASS EXCISION: SHX2000

## 2012-06-23 SURGERY — EXCISION MASS
Anesthesia: General | Site: Chest | Wound class: Clean

## 2012-06-23 MED ORDER — CHLORHEXIDINE GLUCONATE 4 % EX LIQD
1.0000 "application " | Freq: Once | CUTANEOUS | Status: AC
  Administered 2012-06-23: 1 via TOPICAL

## 2012-06-23 MED ORDER — GLYCOPYRROLATE 0.2 MG/ML IJ SOLN
INTRAMUSCULAR | Status: AC
  Filled 2012-06-23: qty 2

## 2012-06-23 MED ORDER — ONDANSETRON HCL 4 MG/2ML IJ SOLN: 4.0000 mg | Freq: Once | INTRAMUSCULAR | Status: DC | PRN

## 2012-06-23 MED ORDER — LIDOCAINE HCL (CARDIAC) 10 MG/ML IV SOLN
INTRAVENOUS | Status: DC | PRN
  Administered 2012-06-23: 20 mg via INTRAVENOUS

## 2012-06-23 MED ORDER — GLYCOPYRROLATE 0.2 MG/ML IJ SOLN
INTRAMUSCULAR | Status: AC
  Filled 2012-06-23: qty 1

## 2012-06-23 MED ORDER — FENTANYL CITRATE 0.05 MG/ML IJ SOLN: 25.0000 ug | INTRAMUSCULAR | Status: DC | PRN

## 2012-06-23 MED ORDER — SODIUM CHLORIDE 0.9 % IR SOLN
Status: DC | PRN
  Administered 2012-06-23: 1000 mL

## 2012-06-23 MED ORDER — SUCCINYLCHOLINE CHLORIDE 20 MG/ML IJ SOLN
INTRAMUSCULAR | Status: DC | PRN
  Administered 2012-06-23: 100 mg via INTRAVENOUS

## 2012-06-23 MED ORDER — LACTATED RINGERS IV SOLN
INTRAVENOUS | Status: DC
  Administered 2012-06-23: 1000 mL via INTRAVENOUS
  Administered 2012-06-23: 11:00:00 via INTRAVENOUS

## 2012-06-23 MED ORDER — PROPOFOL 10 MG/ML IV EMUL
INTRAVENOUS | Status: AC
  Filled 2012-06-23: qty 20

## 2012-06-23 MED ORDER — MIDAZOLAM HCL 2 MG/2ML IJ SOLN
1.0000 mg | INTRAMUSCULAR | Status: DC | PRN
  Administered 2012-06-23: 2 mg via INTRAVENOUS

## 2012-06-23 MED ORDER — GLYCOPYRROLATE 0.2 MG/ML IJ SOLN
INTRAMUSCULAR | Status: DC | PRN
  Administered 2012-06-23: 0.4 mg via INTRAVENOUS
  Administered 2012-06-23: 0.2 mg via INTRAVENOUS

## 2012-06-23 MED ORDER — MIDAZOLAM HCL 2 MG/2ML IJ SOLN
INTRAMUSCULAR | Status: AC
  Filled 2012-06-23: qty 2

## 2012-06-23 MED ORDER — BUPIVACAINE HCL 0.5 % IJ SOLN
INTRAMUSCULAR | Status: DC | PRN
  Administered 2012-06-23: 9 mL

## 2012-06-23 MED ORDER — KETOROLAC TROMETHAMINE 30 MG/ML IJ SOLN
30.0000 mg | Freq: Once | INTRAMUSCULAR | Status: AC
  Administered 2012-06-23: 30 mg via INTRAVENOUS

## 2012-06-23 MED ORDER — PROPOFOL 10 MG/ML IV BOLUS
INTRAVENOUS | Status: DC | PRN
  Administered 2012-06-23: 150 mg via INTRAVENOUS

## 2012-06-23 MED ORDER — FENTANYL CITRATE 0.05 MG/ML IJ SOLN
INTRAMUSCULAR | Status: DC | PRN
  Administered 2012-06-23 (×2): 50 ug via INTRAVENOUS

## 2012-06-23 MED ORDER — ROCURONIUM BROMIDE 100 MG/10ML IV SOLN
INTRAVENOUS | Status: DC | PRN
  Administered 2012-06-23: 5 mg via INTRAVENOUS
  Administered 2012-06-23: 25 mg via INTRAVENOUS

## 2012-06-23 MED ORDER — FENTANYL CITRATE 0.05 MG/ML IJ SOLN
INTRAMUSCULAR | Status: AC
  Filled 2012-06-23: qty 2

## 2012-06-23 MED ORDER — ROCURONIUM BROMIDE 50 MG/5ML IV SOLN
INTRAVENOUS | Status: AC
  Filled 2012-06-23: qty 1

## 2012-06-23 MED ORDER — BUPIVACAINE HCL (PF) 0.5 % IJ SOLN
INTRAMUSCULAR | Status: AC
  Filled 2012-06-23: qty 30

## 2012-06-23 MED ORDER — SUCCINYLCHOLINE CHLORIDE 20 MG/ML IJ SOLN
INTRAMUSCULAR | Status: AC
  Filled 2012-06-23: qty 1

## 2012-06-23 MED ORDER — KETOROLAC TROMETHAMINE 30 MG/ML IJ SOLN
INTRAMUSCULAR | Status: AC
  Filled 2012-06-23: qty 1

## 2012-06-23 MED ORDER — NEOSTIGMINE METHYLSULFATE 1 MG/ML IJ SOLN
INTRAMUSCULAR | Status: DC | PRN
  Administered 2012-06-23: 2 mg via INTRAVENOUS

## 2012-06-23 SURGICAL SUPPLY — 32 items
BAG HAMPER (MISCELLANEOUS) ×2 IMPLANT
BLADE SURG 15 STRL LF DISP TIS (BLADE) ×1 IMPLANT
BLADE SURG 15 STRL SS (BLADE) ×1
CLOTH BEACON ORANGE TIMEOUT ST (SAFETY) ×2 IMPLANT
COVER LIGHT HANDLE STERIS (MISCELLANEOUS) ×4 IMPLANT
DECANTER SPIKE VIAL GLASS SM (MISCELLANEOUS) ×2 IMPLANT
DERMABOND ADVANCED (GAUZE/BANDAGES/DRESSINGS) ×1
DERMABOND ADVANCED .7 DNX12 (GAUZE/BANDAGES/DRESSINGS) ×1 IMPLANT
DURAPREP 26ML APPLICATOR (WOUND CARE) ×2 IMPLANT
ELECT NEEDLE TIP 2.8 STRL (NEEDLE) IMPLANT
ELECT REM PT RETURN 9FT ADLT (ELECTROSURGICAL) ×2
ELECTRODE REM PT RTRN 9FT ADLT (ELECTROSURGICAL) ×1 IMPLANT
FORMALIN 10 PREFIL 120ML (MISCELLANEOUS) ×2 IMPLANT
GLOVE BIO SURGEON STRL SZ7.5 (GLOVE) ×2 IMPLANT
GLOVE BIOGEL PI IND STRL 7.5 (GLOVE) ×1 IMPLANT
GLOVE BIOGEL PI INDICATOR 7.5 (GLOVE) ×1
GLOVE ECLIPSE 6.5 STRL STRAW (GLOVE) ×2 IMPLANT
GLOVE ECLIPSE 7.0 STRL STRAW (GLOVE) ×2 IMPLANT
GOWN STRL REIN XL XLG (GOWN DISPOSABLE) ×6 IMPLANT
KIT ROOM TURNOVER APOR (KITS) ×2 IMPLANT
MANIFOLD NEPTUNE II (INSTRUMENTS) ×2 IMPLANT
NEEDLE HYPO 25X1 1.5 SAFETY (NEEDLE) ×2 IMPLANT
NS IRRIG 1000ML POUR BTL (IV SOLUTION) ×2 IMPLANT
PACK MINOR (CUSTOM PROCEDURE TRAY) ×2 IMPLANT
PAD ARMBOARD 7.5X6 YLW CONV (MISCELLANEOUS) ×2 IMPLANT
SET BASIN LINEN APH (SET/KITS/TRAYS/PACK) ×2 IMPLANT
SUT ETHILON 3 0 FSL (SUTURE) IMPLANT
SUT PROLENE 4 0 PS 2 18 (SUTURE) IMPLANT
SUT VIC AB 3-0 SH 27 (SUTURE) ×1
SUT VIC AB 3-0 SH 27X BRD (SUTURE) ×1 IMPLANT
SUT VIC AB 4-0 PS2 27 (SUTURE) ×2 IMPLANT
SYR CONTROL 10ML LL (SYRINGE) ×2 IMPLANT

## 2012-06-23 NOTE — Op Note (Signed)
Patient:  Chad Foster  DOB:  [birth year]  MRN:  [id number]   Preop Diagnosis:  Subcutaneous neoplasm, chest wall  Postop Diagnosis:  Same  Procedure:  Excision of neoplasm, 7cm, right chest wall  Surgeon:  Franky Macho, M.D.  Anes:  General endotracheal  Indications:  Patient is a 42 year old white male presents with an enlarging subcutaneous mass along the right lateral chest wall. The risks and benefits of the procedure were fully explained to the patient, who gave informed consent.  Procedure note:  The patient was placed in the left lateral decubitus position after induction of general endotracheal anesthesia. The right lateral chest wall was prepped and draped using the usual sterile technique with DuraPrep. Surgical site confirmation was performed.  An incision was made over the ovoid subcutaneous mass along the mid to posterior clavicular line, right lower chest. The dissection was taken down to the subcutaneous mass. Measure approximately 7 cm its greatest diameter. It was connected to the chest wall. It was excised using Bovie electrocautery and sent to pathology further examination. It appeared to be a lipoma. The subcutaneous layer was reapproximated using 3-0 Vicryl interrupted suture. The skin was closed using a 4 Vicryl subcuticular suture. 0.5% Sensorcaine was instilled the surrounding wound. Dermabond was then applied.  All tape and needle counts were correct at the end of the procedure. Patient was extubated in the operating room and transferred to PACU in stable condition.  Complications:  None  EBL:  Minimal  Specimen:  Mass, right chest wall, subcutaneous

## 2012-06-23 NOTE — Transfer of Care (Signed)
Immediate Anesthesia Transfer of Care Note  Patient: Chad Foster  Procedure(s) Performed: Procedure(s) (LRB): EXCISION MASS (N/A)  Patient Location: PACU  Anesthesia Type: General  Level of Consciousness: awake  Airway & Oxygen Therapy: Patient Spontanous Breathing and non-rebreather face mask  Post-op Assessment: Report given to PACU RN, Post -op Vital signs reviewed and stable and Patient moving all extremities  Post vital signs: Reviewed and stable  Complications: No apparent anesthesia complications

## 2012-06-23 NOTE — Anesthesia Postprocedure Evaluation (Signed)
Anesthesia Post Note  Patient: Chad Foster  Procedure(s) Performed: Procedure(s) (LRB): EXCISION MASS (N/A)  Anesthesia type: General  Patient location: PACU  Post pain: Pain level controlled  Post assessment: Post-op Vital signs reviewed, Patient's Cardiovascular Status Stable, Respiratory Function Stable, Patent Airway, No signs of Nausea or vomiting and Pain level controlled  Last Vitals:  Filed Vitals:   06/23/12 1117  BP: 110/58  Pulse: 88  Temp: 36.4 C  Resp: 18    Post vital signs: Reviewed and stable  Level of consciousness: awake and alert   Complications: No apparent anesthesia complications

## 2012-06-23 NOTE — Interval H&P Note (Signed)
History and Physical Interval Note:  06/23/2012 9:55 AM  Chad Foster  has presented today for surgery, with the diagnosis of Chest Wall Mass  The various methods of treatment have been discussed with the patient and family. After consideration of risks, benefits and other options for treatment, the patient has consented to  Procedure(s) (LRB) with comments: EXCISION MASS (N/A) - Excision Chest Wall Mass as a surgical intervention .  The patient's history has been reviewed, patient examined, no change in status, stable for surgery.  I have reviewed the patient's chart and labs.  Questions were answered to the patient's satisfaction.     Franky Macho A

## 2012-06-23 NOTE — Anesthesia Preprocedure Evaluation (Signed)
Anesthesia Evaluation  Patient identified by MRN, date of birth, ID band  Reviewed: Allergy & Precautions, H&P , NPO status , Patient's Chart, lab work & pertinent test results  History of Anesthesia Complications Negative for: history of anesthetic complications  Airway Mallampati: I TM Distance: >3 FB     Dental  (+) Teeth Intact and Implants,    Pulmonary former smoker,  breath sounds clear to auscultation        Cardiovascular hypertension, Pt. on medications Rhythm:Regular Rate:Normal     Neuro/Psych PSYCHIATRIC DISORDERS Anxiety Depression    GI/Hepatic GERD-  Medicated and Controlled,  Endo/Other    Renal/GU      Musculoskeletal   Abdominal   Peds  Hematology   Anesthesia Other Findings   Reproductive/Obstetrics                           Anesthesia Physical Anesthesia Plan  ASA: II  Anesthesia Plan: General   Post-op Pain Management:    Induction: Intravenous, Rapid sequence and Cricoid pressure planned  Airway Management Planned: Oral ETT  Additional Equipment:   Intra-op Plan:   Post-operative Plan: Extubation in OR  Informed Consent: I have reviewed the patients History and Physical, chart, labs and discussed the procedure including the risks, benefits and alternatives for the proposed anesthesia with the patient or authorized representative who has indicated his/her understanding and acceptance.     Plan Discussed with:   Anesthesia Plan Comments:         Anesthesia Quick Evaluation

## 2012-06-23 NOTE — Anesthesia Procedure Notes (Signed)
Procedure Name: Intubation Date/Time: 06/23/2012 10:26 AM Performed by: Franco Nones Pre-anesthesia Checklist: Patient identified, Patient being monitored, Timeout performed, Emergency Drugs available and Suction available Patient Re-evaluated:Patient Re-evaluated prior to inductionOxygen Delivery Method: Circle System Utilized Preoxygenation: Pre-oxygenation with 100% oxygen Intubation Type: IV induction, Cricoid Pressure applied and Rapid sequence Ventilation: Mask ventilation without difficulty Laryngoscope Size: Miller and 2 Grade View: Grade I Tube type: Oral Tube size: 7.0 mm Number of attempts: 1 Airway Equipment and Method: stylet Placement Confirmation: ETT inserted through vocal cords under direct vision,  positive ETCO2 and breath sounds checked- equal and bilateral Secured at: 21 cm Tube secured with: Tape Dental Injury: Teeth and Oropharynx as per pre-operative assessment

## 2012-06-25 ENCOUNTER — Encounter (HOSPITAL_COMMUNITY): Payer: Self-pay | Admitting: General Surgery

## 2012-07-02 ENCOUNTER — Ambulatory Visit (HOSPITAL_COMMUNITY): Payer: Self-pay | Admitting: Psychology

## 2012-08-08 ENCOUNTER — Ambulatory Visit (HOSPITAL_COMMUNITY): Payer: Self-pay | Admitting: Psychology

## 2012-08-14 ENCOUNTER — Ambulatory Visit (HOSPITAL_COMMUNITY): Payer: Self-pay | Admitting: Psychology

## 2012-08-26 ENCOUNTER — Ambulatory Visit (INDEPENDENT_AMBULATORY_CARE_PROVIDER_SITE_OTHER): Payer: 59 | Admitting: Psychology

## 2012-08-26 ENCOUNTER — Encounter (HOSPITAL_COMMUNITY): Payer: Self-pay | Admitting: Psychology

## 2012-08-26 DIAGNOSIS — G894 Chronic pain syndrome: Secondary | ICD-10-CM

## 2012-08-26 DIAGNOSIS — F329 Major depressive disorder, single episode, unspecified: Secondary | ICD-10-CM

## 2012-08-26 DIAGNOSIS — F419 Anxiety disorder, unspecified: Secondary | ICD-10-CM

## 2012-08-26 DIAGNOSIS — F411 Generalized anxiety disorder: Secondary | ICD-10-CM

## 2012-08-26 DIAGNOSIS — F3289 Other specified depressive episodes: Secondary | ICD-10-CM

## 2012-08-26 NOTE — Progress Notes (Signed)
Patient:  Chad Foster   DOB: May 19, 1971  MR Number: [id number]  Location: BEHAVIORAL [facility] [address] [place] [address] Dept: [phone]  Start: 11 AM End: 12 PM  Provider/Observer:     Hershal Coria PSYD  Chief Complaint:      Chief Complaint  Patient presents with  . Depression  . Anxiety  . Stress  . Other    Chronic pain from a significant nerve impingement in his back    Reason For Service:     The patient was referred by Dr. Andrey Campanile because of issues related to depression after being out of work due to severe back pain. The patient reports that he had disc rupture and subsequent surgery in L5/S1. The patient recuperated from this for the most part and try to go back to work. He began developing more and more pain and saw Dr. Danielle Dess again. He has been having increasing pain difficulties and has missed a lot of work and been limited as far as his physical activity. He has also received spinal injections as well. The patient reports that there is no other surgery possibility. The patient has had issues with depression to some degree of the past 10 years and has been treated with both Paxil and Effexor in the past. The patient did have a time where he stopped taking these for one week and had a significant setback from his depression symptoms and is back taking the SSRI medications again.    Interventions Strategy:  Cognitive/behavioral psychotherapeutic interventions  Participation Level:   Active  Participation Quality:  Appropriate      Behavioral Observation:  Well Groomed, Alert, and Appropriate.   Current Psychosocial Factors: The patient reports that he is still doing very poorly and has not been a significant change in his overall levels of pain symptoms. He reports that he is continued to be seen at the pain clinic but is been very frustrated by his attempts to try to get I  nerve root stimulator/TENS unit. He has talk to his insurance company and they said that they will need more information from the neurologist about this procedure before they would approve it the patient reports it is hard for him to deal with some of the people that he has to be around the pain management clinic as he feels that most of these people are drug seeker is in some way or have significant problems. The patient elected it would take less narcotic pain meds himself. The patient reports that he would like to see about trying another pain clinic if possible.  Content of Session:   Reviewed current symptoms and continue to work on building better coping skills and strategies around issues of his symptoms of depression.  Current Status:   The patient continues to experience significant pain and has been having to use narcotic pain medications 2-3 times a day. The patient reports that he has 2 struggle with this issue and desperately would like to reduce his amount of narcotic pain medicine.  Patient Progress:   Stable  Goals Addressed Today:    Goals addressed today have to do with increasing his coping skills and strategies around issues of recurrent depression and better dealing with when he is currently going through.  Impression/Diagnosis:   At this point, the patient does have some significant residual pain problems from his ruptured disc. He is having difficulty maintaining his work and is struggling  financially and dealing with not being able to work like he had been. At this point, there does appear to be significant symptoms of depression.   Diagnosis:    Axis I:  Depressive disorder  Anxiety  Chronic pain syndrome      Axis II: No diagnosis

## 2012-09-18 ENCOUNTER — Other Ambulatory Visit: Payer: Self-pay | Admitting: Family Medicine

## 2012-09-23 ENCOUNTER — Ambulatory Visit (HOSPITAL_COMMUNITY): Payer: Self-pay | Admitting: Psychology

## 2012-09-26 ENCOUNTER — Ambulatory Visit (INDEPENDENT_AMBULATORY_CARE_PROVIDER_SITE_OTHER): Payer: 59 | Admitting: Psychology

## 2012-09-26 DIAGNOSIS — F329 Major depressive disorder, single episode, unspecified: Secondary | ICD-10-CM

## 2012-09-26 DIAGNOSIS — F411 Generalized anxiety disorder: Secondary | ICD-10-CM

## 2012-09-26 DIAGNOSIS — F419 Anxiety disorder, unspecified: Secondary | ICD-10-CM

## 2012-09-26 DIAGNOSIS — G894 Chronic pain syndrome: Secondary | ICD-10-CM

## 2012-10-08 ENCOUNTER — Encounter: Payer: Self-pay | Admitting: *Deleted

## 2012-10-09 DIAGNOSIS — Z0289 Encounter for other administrative examinations: Secondary | ICD-10-CM

## 2012-10-10 ENCOUNTER — Encounter: Payer: Self-pay | Admitting: Family Medicine

## 2012-10-10 ENCOUNTER — Ambulatory Visit (INDEPENDENT_AMBULATORY_CARE_PROVIDER_SITE_OTHER): Payer: Commercial Managed Care - PPO | Admitting: Family Medicine

## 2012-10-10 VITALS — BP 132/90 | Wt 213.0 lb

## 2012-10-10 DIAGNOSIS — M549 Dorsalgia, unspecified: Secondary | ICD-10-CM

## 2012-10-10 DIAGNOSIS — G8929 Other chronic pain: Secondary | ICD-10-CM | POA: Insufficient documentation

## 2012-10-10 DIAGNOSIS — G2581 Restless legs syndrome: Secondary | ICD-10-CM | POA: Insufficient documentation

## 2012-10-10 DIAGNOSIS — I10 Essential (primary) hypertension: Secondary | ICD-10-CM | POA: Insufficient documentation

## 2012-10-10 DIAGNOSIS — M5416 Radiculopathy, lumbar region: Secondary | ICD-10-CM

## 2012-10-10 DIAGNOSIS — K219 Gastro-esophageal reflux disease without esophagitis: Secondary | ICD-10-CM

## 2012-10-10 DIAGNOSIS — IMO0002 Reserved for concepts with insufficient information to code with codable children: Secondary | ICD-10-CM

## 2012-10-10 MED ORDER — DICLOFENAC SODIUM 1 % TD GEL: 4.0000 g | Freq: Four times a day (QID) | TRANSDERMAL | Status: DC

## 2012-10-10 NOTE — Progress Notes (Signed)
  Subjective:    Patient ID: Chad Foster, male    DOB: [birth year], 42 y.o.   MRN: [id number]  HPI [facility] going monthly. Not helping much. Spinal cord stimulator not moving do to their challenges with insur coverage. communic not good. Do not see doc. Usually sees a PA. Fills out a lot of papers. Questions not read. Taking three oxys per day. Was hoping spinal injec would help.lot of pain in the am.  Compliant with bp meds. Numbers usually good. Watching salt intake.  Reflux small flare recently. Worse after milkshake generally in good control.  rls doing fine on the mirapex. More dreams lately.  depr stale. Sees rodenbough 2-3 wks, staying on meds. No suic thoughts.   Review of Systems ROS otherwise negative    Objective:   Physical Exam Alert no acute distress. HEENT normal. Lungs clear. Heart rare rhythm. Abdomen benign. Low back pain to percussion. Plus minus straight leg raise. On right       Assessment & Plan:  #1 hypertension good control. Blood pressure good on repeat. #2 restless leg syndrome clinically stable discussed. #3 reflux stable discussed. #4 chronic pain patient frustrated by current system at [facility] clinic plan referral per patient request. Maintain other meds. Diet exercise discussed. Easily 25-30 minutes spent most in discussion. WSL also trial of Voltaren gel. Recheck in 4 months.

## 2012-10-14 ENCOUNTER — Encounter (HOSPITAL_COMMUNITY): Payer: Self-pay | Admitting: Psychology

## 2012-10-14 NOTE — Progress Notes (Signed)
Patient:  Chad Foster   DOB: 30-Dec-1970  MR Number: [id number]  Location: BEHAVIORAL [facility] [address] [place] [address] Dept: [phone]  Start: 1 PM End: 2 PM  Provider/Observer:     Hershal Coria PSYD  Chief Complaint:      Chief Complaint  Patient presents with  . Anxiety  . Depression  . Stress    Reason For Service:     The patient was referred by Dr. Andrey Campanile because of issues related to depression after being out of work due to severe back pain. The patient reports that he had disc rupture and subsequent surgery in L5/S1. The patient recuperated from this for the most part and try to go back to work. He began developing more and more pain and saw Dr. Danielle Dess again. He has been having increasing pain difficulties and has missed a lot of work and been limited as far as his physical activity. He has also received spinal injections as well. The patient reports that there is no other surgery possibility. The patient has had issues with depression to some degree of the past 10 years and has been treated with both Paxil and Effexor in the past. The patient did have a time where he stopped taking these for one week and had a significant setback from his depression symptoms and is back taking the SSRI medications again.    Interventions Strategy:  Cognitive/behavioral psychotherapeutic interventions  Participation Level:   Active  Participation Quality:  Appropriate      Behavioral Observation:  Well Groomed, Alert, and Appropriate.   Current Psychosocial Factors: The patient continues to struggle with his inability to engage in regular daily activities. He has not been able to work and continues to experience severe pain. The patient reports that his inability to do things around the house makes him feel worthless even though his wife insists that he is helpful to her. He tries to do many  cooking or cleaning that he can do but it is still greatly limited and frustrates him greatly.  Content of Session:   Reviewed current symptoms and continue to work on building better coping skills and strategies around issues of his symptoms of depression.  Current Status:   The patient continues to experience severe chronic pain with some extremely severe days and some days that are of little bit milder but still severe. The patient reports he is severely limited in what he can do around the house and his self-esteem is quite low and has been experiencing depression. He has been working towards getting approval for an implant and TENS unit but he has not been successful with getting that scheduled.  Patient Progress:   Stable  Goals Addressed Today:    Goals addressed today have to do with increasing his coping skills and strategies around issues of recurrent depression and better dealing with when he is currently going through.  Impression/Diagnosis:   At this point, the patient does have some significant residual pain problems from his ruptured disc. He is having difficulty maintaining his work and is struggling financially and dealing with not being able to work like he had been. At this point, there does appear to be significant symptoms of depression.   Diagnosis:    Axis I:  Depressive disorder  Anxiety  Chronic pain syndrome      Axis II: No diagnosis

## 2012-10-15 ENCOUNTER — Other Ambulatory Visit: Payer: Self-pay | Admitting: Family Medicine

## 2012-10-17 ENCOUNTER — Ambulatory Visit (HOSPITAL_COMMUNITY): Payer: Self-pay | Admitting: Psychology

## 2012-10-24 ENCOUNTER — Ambulatory Visit (INDEPENDENT_AMBULATORY_CARE_PROVIDER_SITE_OTHER): Payer: 59 | Admitting: Psychology

## 2012-10-24 DIAGNOSIS — G894 Chronic pain syndrome: Secondary | ICD-10-CM

## 2012-10-24 DIAGNOSIS — F329 Major depressive disorder, single episode, unspecified: Secondary | ICD-10-CM

## 2012-10-24 DIAGNOSIS — F419 Anxiety disorder, unspecified: Secondary | ICD-10-CM

## 2012-10-24 DIAGNOSIS — F411 Generalized anxiety disorder: Secondary | ICD-10-CM

## 2012-11-03 ENCOUNTER — Other Ambulatory Visit: Payer: Self-pay | Admitting: *Deleted

## 2012-11-03 MED ORDER — ALPRAZOLAM 1 MG PO TABS: 1.0000 mg | ORAL_TABLET | Freq: Three times a day (TID) | ORAL | Status: DC | PRN

## 2012-11-12 ENCOUNTER — Emergency Department (HOSPITAL_COMMUNITY)
Admission: EM | Admit: 2012-11-12 | Discharge: 2012-11-12 | Discharge status: Against Medical Advice | Payer: 59 | Attending: Emergency Medicine | Admitting: Emergency Medicine

## 2012-11-12 ENCOUNTER — Encounter (HOSPITAL_COMMUNITY): Payer: Self-pay | Admitting: *Deleted

## 2012-11-12 ENCOUNTER — Telehealth: Payer: Self-pay | Admitting: Family Medicine

## 2012-11-12 DIAGNOSIS — K219 Gastro-esophageal reflux disease without esophagitis: Secondary | ICD-10-CM | POA: Insufficient documentation

## 2012-11-12 DIAGNOSIS — G4700 Insomnia, unspecified: Secondary | ICD-10-CM | POA: Insufficient documentation

## 2012-11-12 DIAGNOSIS — F3289 Other specified depressive episodes: Secondary | ICD-10-CM | POA: Insufficient documentation

## 2012-11-12 DIAGNOSIS — M545 Low back pain, unspecified: Secondary | ICD-10-CM | POA: Insufficient documentation

## 2012-11-12 DIAGNOSIS — Z8669 Personal history of other diseases of the nervous system and sense organs: Secondary | ICD-10-CM | POA: Insufficient documentation

## 2012-11-12 DIAGNOSIS — G8929 Other chronic pain: Secondary | ICD-10-CM | POA: Insufficient documentation

## 2012-11-12 DIAGNOSIS — I10 Essential (primary) hypertension: Secondary | ICD-10-CM | POA: Insufficient documentation

## 2012-11-12 DIAGNOSIS — M549 Dorsalgia, unspecified: Secondary | ICD-10-CM

## 2012-11-12 DIAGNOSIS — Z87891 Personal history of nicotine dependence: Secondary | ICD-10-CM | POA: Insufficient documentation

## 2012-11-12 DIAGNOSIS — Z9889 Other specified postprocedural states: Secondary | ICD-10-CM | POA: Insufficient documentation

## 2012-11-12 DIAGNOSIS — R209 Unspecified disturbances of skin sensation: Secondary | ICD-10-CM | POA: Insufficient documentation

## 2012-11-12 DIAGNOSIS — M6281 Muscle weakness (generalized): Secondary | ICD-10-CM | POA: Insufficient documentation

## 2012-11-12 DIAGNOSIS — F411 Generalized anxiety disorder: Secondary | ICD-10-CM | POA: Insufficient documentation

## 2012-11-12 DIAGNOSIS — Z79899 Other long term (current) drug therapy: Secondary | ICD-10-CM | POA: Insufficient documentation

## 2012-11-12 DIAGNOSIS — F329 Major depressive disorder, single episode, unspecified: Secondary | ICD-10-CM | POA: Insufficient documentation

## 2012-11-12 MED ORDER — HYDROMORPHONE HCL PF 1 MG/ML IJ SOLN: 1.0000 mg | Freq: Once | INTRAMUSCULAR | Status: DC

## 2012-11-12 NOTE — Telephone Encounter (Signed)
Discussed with wife

## 2012-11-12 NOTE — ED Notes (Signed)
Pt states got up yesterday and had cramp in left leg.  Pt states that he got up yesterday and now with increased pain from left lower back, down left leg, and pt reports numbness to left leg.  Pulses present.  Pt has history of back surgery

## 2012-11-12 NOTE — Telephone Encounter (Signed)
No, we don't order MRIs ahead of visits for pain. Pt may need to go to ER with acuity and severity of pain

## 2012-11-12 NOTE — ED Notes (Signed)
Pt sts does not want to wait here for MRI- PA to be made aware.

## 2012-11-12 NOTE — Telephone Encounter (Signed)
Pts wife has called stating that the pt is unable to move this morning. We have made an appt for Friday morning but she wants to know if he needs to go get another MRI before this appt? He has been in pain for several days, now with a decline in his ability to function. Please advise as soon as possible. Thanks

## 2012-11-12 NOTE — ED Provider Notes (Signed)
History     CSN: [id number]  Arrival date & time 11/12/12  1106   First MD Initiated Contact with Patient 11/12/12 1123      Chief Complaint  Patient presents with  . Back Pain    (Consider location/radiation/quality/duration/timing/severity/associated sxs/prior treatment) HPI TION TSE is a 42 y.o. male who presents to ED with complaint of back pain. States has chronic pain, followed  By pain clinic. Hx of laminectomy for similar pain with right side deficit. States this time, pain started yesterday, radiating down left leg. States new left leg weakness and tingling. Denies loss of bowels or urinary retention or incontinence. Denies fever dysuria, abdominal pain. Taking percocet at home with no relief. No new injuries. States called his neurosurgeon who is out of town. States went to see his PCP who told him to come to ER to get MRI.    Past Medical History  Diagnosis Date  . Hypertension   . Anxiety   . Depression   . GERD (gastroesophageal reflux disease)   . Restless leg   . Back pain   . Insomnia     Past Surgical History  Procedure Laterality Date  . Back surgery    . Cholecystectomy    . Nasal sinus surgery    . Finger fracture surgery      reattatched  . Mass excision  06/23/2012    Procedure: EXCISION MASS;  Surgeon: Dalia Heading, MD;  Location: [facility];  Service: General;  Laterality: N/A;  Excision Chest Wall Mass    Family History  Problem Relation Age of Onset  . Cancer - Lung Father     History  Substance Use Topics  . Smoking status: Former Smoker -- 1.50 packs/day for 15 years    Types: Cigars, Cigarettes    Quit date: 06/20/1994  . Smokeless tobacco: Not on file  . Alcohol Use: Yes     Comment: weekend beers      Review of Systems  Constitutional: Negative for fever and chills.  Respiratory: Negative.   Cardiovascular: Negative.   Genitourinary: Negative for dysuria and flank pain.  Musculoskeletal: Positive for back pain.   Neurological: Positive for weakness and numbness.  All other systems reviewed and are negative.    Allergies  Review of patient's allergies indicates no known allergies.  Home Medications   Current Outpatient Rx  Name  Route  Sig  Dispense  Refill  . ALPRAZolam (XANAX) 1 MG tablet   Oral   Take 1 mg by mouth 3 (three) times daily as needed for sleep.         . celecoxib (CELEBREX) 200 MG capsule   Oral   Take 200 mg by mouth 2 (two) times daily as needed. For pain          . dexlansoprazole (DEXILANT) 60 MG capsule   Oral   Take 60 mg by mouth daily.         . diclofenac sodium (VOLTAREN) 1 % GEL   Topical   Apply 4 g topically 4 (four) times daily.   100 g   6   . lidocaine (LIDODERM) 5 %   Transdermal   Place 1 patch onto the skin daily. Remove & Discard patch within 12 hours or as directed by MD         . olmesartan (BENICAR) 40 MG tablet   Oral   Take 40 mg by mouth at bedtime.          Marland Kitchen  oxyCODONE-acetaminophen (PERCOCET) 10-325 MG per tablet   Oral   Take 1-1.5 tablets by mouth 4 (four) times daily.          Marland Kitchen venlafaxine XR (EFFEXOR-XR) 150 MG 24 hr capsule   Oral   Take 150 mg by mouth daily.         . pramipexole (MIRAPEX) 1 MG tablet   Oral   Take 1 mg by mouth 3 (three) times daily.           BP 125/74  Pulse 107  Temp(Src) 97.7 F (36.5 C) (Oral)  Resp 18  SpO2 100%  Physical Exam  Nursing note and vitals reviewed. Constitutional: He is oriented to person, place, and time. He appears well-developed and well-nourished. No distress.  HENT:  Head: Normocephalic.  Eyes: Conjunctivae are normal.  Cardiovascular: Normal rate, regular rhythm and normal heart sounds.   Pulmonary/Chest: Effort normal and breath sounds normal. No respiratory distress. He has no wheezes. He has no rales.  Musculoskeletal:  Midline lumbar spine tenderness. Pain with left straight leg raise.   Neurological: He is alert and oriented to person,  place, and time.  5/5 and equal lower extremity strength. 2+ and equal patellar reflexes bilaterally. Pt able to dorsiflex bilateral toes and feet with good strength against resistance. Equal sensation bilaterally over thighs and lower legs.   Skin: Skin is warm and dry.    ED Course  Procedures (including critical care time)  Labs Reviewed - No data to display No results found.  Pt requesting MRI. States worried he may have another "ruptured disk" given new symptoms. Pain medications ordered.     12:49 PM Pt eloped, told nursing staff did not want to wait for MRI. Did not want to wait for me to discharge him.   1. Back pain       MDM  Pt with chronic back pain, followed by pain management. Here with increased pain and new numbness and weakness to the left leg. No signs of cauda equina based on exam. PT insisted on MRI to be ordered, i discussed with Dr. Blinda Leatherwood, who agreed that it may be reasonable to order MRI. MRI ordered, however, pt refused to wait on the test and eloped. Pt was tried to talk into staying by RN, but he did not want to stay. D/c home.   Filed Vitals:   11/12/12 1119  BP: 125/74  Pulse: 107  Temp: 97.7 F (36.5 C)  TempSrc: Oral  Resp: 18  SpO2: 100%          Yazeed Pryer A Briar Sword, PA-C 11/12/12 1550

## 2012-11-12 NOTE — ED Notes (Signed)
Pt exiting room with family. Pt sts he can no longer wait. RN asked it pt would be willing to wait for PA to come out of pt room and give pt discharge instructions. Pt and family refused. Pt refusing to sign AMA as well. Pt ambulated with cane to exit.

## 2012-11-13 ENCOUNTER — Encounter (HOSPITAL_COMMUNITY): Payer: Self-pay | Admitting: Psychology

## 2012-11-13 NOTE — Progress Notes (Signed)
Patient:  Chad Foster   DOB: 1971/04/01  MR Number: [id number]  Location: BEHAVIORAL [facility] [address] [place] [address] Dept: [phone]  Start: 2 PM End: 3 PM  Provider/Observer:     Hershal Coria PSYD  Chief Complaint:      Chief Complaint  Patient presents with  . Anxiety  . Depression  . Stress  . Other    Severe chronic pain    Reason For Service:     The patient was referred by Dr. Andrey Campanile because of issues related to depression after being out of work due to severe back pain. The patient reports that he had disc rupture and subsequent surgery in L5/S1. The patient recuperated from this for the most part and try to go back to work. He began developing more and more pain and saw Dr. Danielle Dess again. He has been having increasing pain difficulties and has missed a lot of work and been limited as far as his physical activity. He has also received spinal injections as well. The patient reports that there is no other surgery possibility. The patient has had issues with depression to some degree of the past 10 years and has been treated with both Paxil and Effexor in the past. The patient did have a time where he stopped taking these for one week and had a significant setback from his depression symptoms and is back taking the SSRI medications again.    Interventions Strategy:  Cognitive/behavioral psychotherapeutic interventions  Participation Level:   Active  Participation Quality:  Appropriate      Behavioral Observation:  Well Groomed, Alert, and Appropriate.   Current Psychosocial Factors: The patient reports that he is still struggling with his inability to help out with his family and around the house anymore close to what he feels is appropriate or that he should be able to do. His physical limitations are quite problematic for him. He is working on getting what is needed to get a  spinal cord stimulator.  Content of Session:   Reviewed current symptoms and continue to work on building better coping skills and strategies around issues of his symptoms of depression.  Current Status:   The patient continues to experience severe chronic pain with some extremely severe days and some days that are of little bit milder but still severe. The patient reports he is severely limited in what he can do around the house and his self-esteem is quite low and has been experiencing depression. He has been working towards getting approval for an implant and TENS unit but he has not been successful with getting that scheduled.  Patient Progress:   Stable  Goals Addressed Today:    Goals addressed today have to do with increasing his coping skills and strategies around issues of recurrent depression and better dealing with when he is currently going through.  Impression/Diagnosis:   At this point, the patient does have some significant residual pain problems from his ruptured disc. He is having difficulty maintaining his work and is struggling financially and dealing with not being able to work like he had been. At this point, there does appear to be significant symptoms of depression.   Diagnosis:    Axis I:  Depressive disorder  Anxiety  Chronic pain syndrome      Axis II: No diagnosis

## 2012-11-13 NOTE — ED Provider Notes (Signed)
Medical screening examination/treatment/procedure(s) were performed by non-physician practitioner and as supervising physician I was immediately available for consultation/collaboration.   Ellamay Fors J. Girolamo Lortie, MD 11/13/12 0708 

## 2012-11-14 ENCOUNTER — Encounter: Payer: Self-pay | Admitting: Family Medicine

## 2012-11-14 ENCOUNTER — Ambulatory Visit (INDEPENDENT_AMBULATORY_CARE_PROVIDER_SITE_OTHER): Payer: BC Managed Care – PPO | Admitting: Family Medicine

## 2012-11-14 VITALS — BP 134/98 | HR 80 | Wt 215.0 lb

## 2012-11-14 DIAGNOSIS — M5430 Sciatica, unspecified side: Secondary | ICD-10-CM

## 2012-11-14 DIAGNOSIS — M545 Low back pain, unspecified: Secondary | ICD-10-CM

## 2012-11-14 DIAGNOSIS — I10 Essential (primary) hypertension: Secondary | ICD-10-CM

## 2012-11-14 DIAGNOSIS — M549 Dorsalgia, unspecified: Secondary | ICD-10-CM

## 2012-11-14 DIAGNOSIS — M5432 Sciatica, left side: Secondary | ICD-10-CM

## 2012-11-14 DIAGNOSIS — G2581 Restless legs syndrome: Secondary | ICD-10-CM

## 2012-11-14 DIAGNOSIS — K219 Gastro-esophageal reflux disease without esophagitis: Secondary | ICD-10-CM

## 2012-11-14 MED ORDER — OLMESARTAN MEDOXOMIL 40 MG PO TABS: 40.0000 mg | ORAL_TABLET | Freq: Every day | ORAL | Status: DC

## 2012-11-14 MED ORDER — DEXLANSOPRAZOLE 60 MG PO CPDR: 60.0000 mg | DELAYED_RELEASE_CAPSULE | Freq: Every day | ORAL | Status: DC

## 2012-11-14 MED ORDER — PRAMIPEXOLE DIHYDROCHLORIDE 1 MG PO TABS: 1.0000 mg | ORAL_TABLET | Freq: Three times a day (TID) | ORAL | Status: DC

## 2012-11-14 MED ORDER — VENLAFAXINE HCL ER 150 MG PO CP24: 150.0000 mg | ORAL_CAPSULE | Freq: Every day | ORAL | Status: DC

## 2012-11-14 MED ORDER — CELECOXIB 200 MG PO CAPS: 200.0000 mg | ORAL_CAPSULE | Freq: Two times a day (BID) | ORAL | Status: DC | PRN

## 2012-11-14 MED ORDER — SILDENAFIL CITRATE 100 MG PO TABS: 100.0000 mg | ORAL_TABLET | ORAL | Status: DC | PRN

## 2012-11-14 NOTE — Progress Notes (Signed)
  Subjective:    Patient ID: Chad Foster, male    DOB: 11/12/1970, 42 y.o.   MRN: [id number]  Back Pain This is a chronic problem. The problem has been rapidly worsening since onset. The pain is present in the lumbar spine. The quality of the pain is described as burning and stabbing. The pain radiates to the left foot. The pain is at a severity of 5/10. The pain is severe. The symptoms are aggravated by bending. He has tried nothing for the symptoms.   Patient also presents for followup and management and prescriptions and preauthorization etc. for all his medicines for his numerous concerns.  Patient on medicine for blood pressure. Trying to watch his diet. Exercising regularly. Compliant with his medicines.  Reports the restless leg syndrome is stable as long as he stays with his medication. No obvious side effects from this.  Patient reports he has to have excellent in order to help his reflux. Was unable to take protonic Nexium and Prilosec.   Review of Systems  Musculoskeletal: Positive for back pain.   Also tiredness and fatigue otherwise within normal limits.    Objective:   Physical Exam  Alert no acute distress. HEENT normal. Blood pressure 134/88 on repeat. Lungs clear. Heart regular in rhythm. Positive left straight leg raise.      Assessment & Plan:  Impression #1 hypertension good control. #2 reflux controlled good but only with stronger medicine. #3 restless leg syndrome ongoing controlled well with medicine. #4 chronic pain. Serious in nature. #5 left-sided sciatica. Dr. Danielle Dess insists that we do an MRI before he sees the patient. Plan all medications reviewed. Diet and exercise discussed. Followup as scheduled. Press on with MRI. Patient advised he will need to stick with a pain specialist as long as he is on this level medications. Easily 35 minutes spent with patient most in discussion regarding his many concerns. WSL

## 2012-11-16 DIAGNOSIS — M5432 Sciatica, left side: Secondary | ICD-10-CM | POA: Insufficient documentation

## 2012-11-19 ENCOUNTER — Ambulatory Visit (HOSPITAL_COMMUNITY)
Admission: RE | Admit: 2012-11-19 | Discharge: 2012-11-19 | Disposition: A | Discharge status: Home / Self Care | Payer: BC Managed Care – PPO | Source: Ambulatory Visit | Attending: Family Medicine | Admitting: Family Medicine

## 2012-11-19 DIAGNOSIS — M545 Low back pain, unspecified: Secondary | ICD-10-CM | POA: Insufficient documentation

## 2012-11-19 DIAGNOSIS — M5137 Other intervertebral disc degeneration, lumbosacral region: Secondary | ICD-10-CM | POA: Insufficient documentation

## 2012-11-19 DIAGNOSIS — R209 Unspecified disturbances of skin sensation: Secondary | ICD-10-CM | POA: Insufficient documentation

## 2012-11-19 DIAGNOSIS — M51379 Other intervertebral disc degeneration, lumbosacral region without mention of lumbar back pain or lower extremity pain: Secondary | ICD-10-CM | POA: Insufficient documentation

## 2012-11-24 ENCOUNTER — Other Ambulatory Visit: Payer: Self-pay | Admitting: Neurological Surgery

## 2012-11-28 ENCOUNTER — Ambulatory Visit (HOSPITAL_COMMUNITY): Payer: 59 | Admitting: Psychology

## 2012-12-11 NOTE — Pre-Procedure Instructions (Signed)
Chad Foster  12/11/2012   Your procedure is scheduled on:  Mon, July 21 @ 11:04 AM  Report to [facility] at 8:00 AM.  Call this number if you have problems the morning of surgery: [phone]   Remember:   Do not eat food or drink liquids after midnight.   Take these medicines the morning of surgery with A SIP OF WATER: Dexilant(Dexlansoprazole),Effexor(Venlafaxine),and Pain Pill(if needed)             Stop taking your Diclofenac 7days prior to surgery.No Goody's,BC's,Aleve,Ibuprofen,Fish Oil,or any Herbal Medications   Do not wear jewelry  Do not wear lotions, powders, or colognes. You may wear deodorant.  Men may shave face and neck.  Do not bring valuables to the hospital.  [name] is not responsible                   for any belongings or valuables.  Contacts, dentures or bridgework may not be worn into surgery.  Leave suitcase in the car. After surgery it may be brought to your room.  For patients admitted to the hospital, checkout time is 11:00 AM the day of  discharge.     Special Instructions: Shower using CHG 2 nights before surgery and the night before surgery.  If you shower the day of surgery use CHG.  Use special wash - you have one bottle of CHG for all showers.  You should use approximately 1/3 of the bottle for each shower.   Please read over the following fact sheets that you were given: Pain Booklet, Coughing and Deep Breathing, Blood Transfusion Information, MRSA Information and Surgical Site Infection Prevention

## 2012-12-12 ENCOUNTER — Encounter (HOSPITAL_COMMUNITY)
Admission: RE | Admit: 2012-12-12 | Discharge: 2012-12-12 | Disposition: A | Discharge status: Home / Self Care | Payer: BC Managed Care – PPO | Source: Ambulatory Visit | Attending: Anesthesiology | Admitting: Anesthesiology

## 2012-12-12 ENCOUNTER — Encounter (HOSPITAL_COMMUNITY)
Admission: RE | Admit: 2012-12-12 | Discharge: 2012-12-12 | Disposition: A | Discharge status: Home / Self Care | Payer: BC Managed Care – PPO | Source: Ambulatory Visit | Attending: Neurological Surgery | Admitting: Neurological Surgery

## 2012-12-12 ENCOUNTER — Encounter (HOSPITAL_COMMUNITY): Payer: Self-pay

## 2012-12-12 DIAGNOSIS — Z01812 Encounter for preprocedural laboratory examination: Secondary | ICD-10-CM | POA: Insufficient documentation

## 2012-12-12 DIAGNOSIS — Z01818 Encounter for other preprocedural examination: Secondary | ICD-10-CM | POA: Insufficient documentation

## 2012-12-12 HISTORY — DX: Benign prostatic hyperplasia without lower urinary tract symptoms: N40.0

## 2012-12-12 HISTORY — DX: Headache: R51

## 2012-12-12 HISTORY — DX: Weakness: R53.1

## 2012-12-12 LAB — CBC
HCT: 44.1 % (ref 39.0–52.0)
Platelets: 220 10*3/uL (ref 150–400)
RDW: 13.5 % (ref 11.5–15.5)
WBC: 8.7 10*3/uL (ref 4.0–10.5)

## 2012-12-12 LAB — SURGICAL PCR SCREEN
MRSA, PCR: NEGATIVE
Staphylococcus aureus: NEGATIVE

## 2012-12-12 LAB — TYPE AND SCREEN: Antibody Screen: NEGATIVE

## 2012-12-12 LAB — BASIC METABOLIC PANEL
Calcium: 9.5 mg/dL (ref 8.4–10.5)
Chloride: 103 mEq/L (ref 96–112)
Creatinine, Ser: 0.87 mg/dL (ref 0.50–1.35)
GFR calc Af Amer: >90 mL/min (ref >90)
Sodium: 140 mEq/L (ref 135–145)

## 2012-12-12 NOTE — Progress Notes (Addendum)
Pt doesn't have a cardiologist  Denies ever having an echo/stress test/heart cath   Medical Md is Dr.Scott Luking  EKG in epic from 06-20-12  Denies CXR in past yr

## 2012-12-16 ENCOUNTER — Encounter (HOSPITAL_COMMUNITY): Payer: Self-pay | Admitting: Pharmacy Technician

## 2012-12-17 ENCOUNTER — Other Ambulatory Visit: Payer: Self-pay | Admitting: Family Medicine

## 2012-12-21 MED ORDER — CEFAZOLIN SODIUM-DEXTROSE 2-3 GM-% IV SOLR
2.0000 g | INTRAVENOUS | Status: AC
  Administered 2012-12-22: 2 g via INTRAVENOUS
  Filled 2012-12-21: qty 50

## 2012-12-22 ENCOUNTER — Encounter (HOSPITAL_COMMUNITY)
Admission: RE | Disposition: A | Discharge status: Home / Self Care | Payer: Self-pay | Source: Ambulatory Visit | Attending: Neurological Surgery

## 2012-12-22 ENCOUNTER — Observation Stay (HOSPITAL_COMMUNITY)
Admission: RE | Admit: 2012-12-22 | Discharge: 2012-12-23 | Disposition: A | Discharge status: Home / Self Care | Payer: BC Managed Care – PPO | Source: Ambulatory Visit | Attending: Neurological Surgery | Admitting: Neurological Surgery

## 2012-12-22 ENCOUNTER — Ambulatory Visit (HOSPITAL_COMMUNITY): Payer: BC Managed Care – PPO

## 2012-12-22 ENCOUNTER — Ambulatory Visit (HOSPITAL_COMMUNITY): Payer: BC Managed Care – PPO | Admitting: Anesthesiology

## 2012-12-22 ENCOUNTER — Encounter (HOSPITAL_COMMUNITY): Payer: Self-pay | Admitting: Surgery

## 2012-12-22 ENCOUNTER — Encounter (HOSPITAL_COMMUNITY): Payer: Self-pay | Admitting: Anesthesiology

## 2012-12-22 DIAGNOSIS — I10 Essential (primary) hypertension: Secondary | ICD-10-CM | POA: Insufficient documentation

## 2012-12-22 DIAGNOSIS — M47817 Spondylosis without myelopathy or radiculopathy, lumbosacral region: Principal | ICD-10-CM | POA: Insufficient documentation

## 2012-12-22 DIAGNOSIS — Z79899 Other long term (current) drug therapy: Secondary | ICD-10-CM | POA: Insufficient documentation

## 2012-12-22 DIAGNOSIS — M5432 Sciatica, left side: Secondary | ICD-10-CM

## 2012-12-22 SURGERY — POSTERIOR LUMBAR FUSION 1 LEVEL
Anesthesia: General | Site: Back | Wound class: Clean

## 2012-12-22 MED ORDER — MIDAZOLAM HCL 5 MG/5ML IJ SOLN
INTRAMUSCULAR | Status: DC | PRN
  Administered 2012-12-22: 2 mg via INTRAVENOUS

## 2012-12-22 MED ORDER — SODIUM CHLORIDE 0.9 % IJ SOLN
3.0000 mL | Freq: Two times a day (BID) | INTRAMUSCULAR | Status: DC
  Administered 2012-12-22: 3 mL via INTRAVENOUS

## 2012-12-22 MED ORDER — NEOSTIGMINE METHYLSULFATE 1 MG/ML IJ SOLN
INTRAMUSCULAR | Status: DC | PRN
  Administered 2012-12-22: 4 mg via INTRAVENOUS

## 2012-12-22 MED ORDER — IRBESARTAN 300 MG PO TABS
300.0000 mg | ORAL_TABLET | Freq: Every day | ORAL | Status: DC
  Administered 2012-12-22: 300 mg via ORAL
  Filled 2012-12-22 (×2): qty 1

## 2012-12-22 MED ORDER — IRBESARTAN 300 MG PO TABS: 300.0000 mg | ORAL_TABLET | Freq: Every day | ORAL | Status: DC

## 2012-12-22 MED ORDER — ACETAMINOPHEN 650 MG RE SUPP: 650.0000 mg | RECTAL | Status: DC | PRN

## 2012-12-22 MED ORDER — SURGIFOAM 100 EX MISC
CUTANEOUS | Status: DC | PRN
  Administered 2012-12-22: 13:00:00 via TOPICAL

## 2012-12-22 MED ORDER — MEPERIDINE HCL 25 MG/ML IJ SOLN: 6.2500 mg | INTRAMUSCULAR | Status: DC | PRN

## 2012-12-22 MED ORDER — METHOCARBAMOL 500 MG PO TABS
ORAL_TABLET | ORAL | Status: AC
  Filled 2012-12-22: qty 1

## 2012-12-22 MED ORDER — HYDROMORPHONE HCL PF 1 MG/ML IJ SOLN
0.2500 mg | INTRAMUSCULAR | Status: DC | PRN
  Administered 2012-12-22 (×2): 0.5 mg via INTRAVENOUS

## 2012-12-22 MED ORDER — PANTOPRAZOLE SODIUM 40 MG PO TBEC: 40.0000 mg | DELAYED_RELEASE_TABLET | Freq: Every day | ORAL | Status: DC

## 2012-12-22 MED ORDER — SODIUM CHLORIDE 0.9 % IJ SOLN: 3.0000 mL | INTRAMUSCULAR | Status: DC | PRN

## 2012-12-22 MED ORDER — MIDAZOLAM HCL 2 MG/2ML IJ SOLN: 0.5000 mg | Freq: Once | INTRAMUSCULAR | Status: DC | PRN

## 2012-12-22 MED ORDER — ONDANSETRON HCL 4 MG/2ML IJ SOLN
INTRAMUSCULAR | Status: DC | PRN
  Administered 2012-12-22: 4 mg via INTRAVENOUS

## 2012-12-22 MED ORDER — ALUM & MAG HYDROXIDE-SIMETH 200-200-20 MG/5ML PO SUSP: 30.0000 mL | Freq: Four times a day (QID) | ORAL | Status: DC | PRN

## 2012-12-22 MED ORDER — SODIUM CHLORIDE 0.9 % IR SOLN
Status: DC | PRN
  Administered 2012-12-22: 13:00:00

## 2012-12-22 MED ORDER — HYDROCODONE-ACETAMINOPHEN 5-325 MG PO TABS: 1.0000 | ORAL_TABLET | ORAL | Status: DC | PRN

## 2012-12-22 MED ORDER — OXYCODONE HCL 5 MG/5ML PO SOLN: 5.0000 mg | Freq: Once | ORAL | Status: AC | PRN

## 2012-12-22 MED ORDER — OXYCODONE HCL 5 MG PO TABS
ORAL_TABLET | ORAL | Status: AC
  Filled 2012-12-22: qty 1

## 2012-12-22 MED ORDER — LACTATED RINGERS IV SOLN
INTRAVENOUS | Status: DC
  Administered 2012-12-22: 09:00:00 via INTRAVENOUS

## 2012-12-22 MED ORDER — PHENOL 1.4 % MT LIQD: 1.0000 | OROMUCOSAL | Status: DC | PRN

## 2012-12-22 MED ORDER — SODIUM CHLORIDE 0.9 % IV SOLN
INTRAVENOUS | Status: AC
  Filled 2012-12-22: qty 500

## 2012-12-22 MED ORDER — OXYCODONE-ACETAMINOPHEN 5-325 MG PO TABS
1.0000 | ORAL_TABLET | ORAL | Status: DC | PRN
  Administered 2012-12-22 – 2012-12-23 (×4): 2 via ORAL
  Filled 2012-12-22 (×4): qty 2

## 2012-12-22 MED ORDER — PROPOFOL 10 MG/ML IV BOLUS
INTRAVENOUS | Status: DC | PRN
  Administered 2012-12-22: 200 mg via INTRAVENOUS

## 2012-12-22 MED ORDER — ROCURONIUM BROMIDE 100 MG/10ML IV SOLN
INTRAVENOUS | Status: DC | PRN
  Administered 2012-12-22: 50 mg via INTRAVENOUS

## 2012-12-22 MED ORDER — METHOCARBAMOL 100 MG/ML IJ SOLN
500.0000 mg | Freq: Four times a day (QID) | INTRAVENOUS | Status: DC | PRN
  Filled 2012-12-22: qty 5

## 2012-12-22 MED ORDER — ONDANSETRON HCL 4 MG/2ML IJ SOLN: 4.0000 mg | INTRAMUSCULAR | Status: DC | PRN

## 2012-12-22 MED ORDER — PROMETHAZINE HCL 25 MG/ML IJ SOLN: 6.2500 mg | INTRAMUSCULAR | Status: DC | PRN

## 2012-12-22 MED ORDER — MORPHINE SULFATE 2 MG/ML IJ SOLN
1.0000 mg | INTRAMUSCULAR | Status: DC | PRN
  Administered 2012-12-22 – 2012-12-23 (×3): 4 mg via INTRAVENOUS
  Filled 2012-12-22 (×3): qty 2

## 2012-12-22 MED ORDER — CEFAZOLIN SODIUM 1-5 GM-% IV SOLN
1.0000 g | Freq: Three times a day (TID) | INTRAVENOUS | Status: AC
  Administered 2012-12-22 – 2012-12-23 (×2): 1 g via INTRAVENOUS
  Filled 2012-12-22 (×2): qty 50

## 2012-12-22 MED ORDER — 0.9 % SODIUM CHLORIDE (POUR BTL) OPTIME
TOPICAL | Status: DC | PRN
  Administered 2012-12-22: 1000 mL

## 2012-12-22 MED ORDER — LACTATED RINGERS IV SOLN
INTRAVENOUS | Status: DC | PRN
  Administered 2012-12-22 (×2): via INTRAVENOUS

## 2012-12-22 MED ORDER — METHOCARBAMOL 500 MG PO TABS
500.0000 mg | ORAL_TABLET | Freq: Four times a day (QID) | ORAL | Status: DC | PRN
  Administered 2012-12-22 – 2012-12-23 (×3): 500 mg via ORAL
  Filled 2012-12-22 (×3): qty 1

## 2012-12-22 MED ORDER — BACITRACIN 50000 UNITS IM SOLR
INTRAMUSCULAR | Status: AC
  Filled 2012-12-22: qty 1

## 2012-12-22 MED ORDER — LIDOCAINE-EPINEPHRINE 1 %-1:100000 IJ SOLN
INTRAMUSCULAR | Status: DC | PRN
  Administered 2012-12-22: 5 mL

## 2012-12-22 MED ORDER — MENTHOL 3 MG MT LOZG: 1.0000 | LOZENGE | OROMUCOSAL | Status: DC | PRN

## 2012-12-22 MED ORDER — ARTIFICIAL TEARS OP OINT
TOPICAL_OINTMENT | OPHTHALMIC | Status: DC | PRN
  Administered 2012-12-22: 1 via OPHTHALMIC

## 2012-12-22 MED ORDER — FENTANYL CITRATE 0.05 MG/ML IJ SOLN
INTRAMUSCULAR | Status: DC | PRN
  Administered 2012-12-22 (×10): 50 ug via INTRAVENOUS

## 2012-12-22 MED ORDER — OXYCODONE HCL 5 MG PO TABS
5.0000 mg | ORAL_TABLET | Freq: Once | ORAL | Status: AC | PRN
  Administered 2012-12-22: 5 mg via ORAL

## 2012-12-22 MED ORDER — SODIUM CHLORIDE 0.9 % IV SOLN: INTRAVENOUS | Status: DC

## 2012-12-22 MED ORDER — PRAMIPEXOLE DIHYDROCHLORIDE 1 MG PO TABS
1.0000 mg | ORAL_TABLET | Freq: Three times a day (TID) | ORAL | Status: DC
  Administered 2012-12-22: 1 mg via ORAL
  Filled 2012-12-22 (×4): qty 1

## 2012-12-22 MED ORDER — ALPRAZOLAM 0.5 MG PO TABS
1.0000 mg | ORAL_TABLET | Freq: Three times a day (TID) | ORAL | Status: DC | PRN
  Administered 2012-12-22: 1 mg via ORAL
  Filled 2012-12-22: qty 2

## 2012-12-22 MED ORDER — LIDOCAINE HCL (CARDIAC) 20 MG/ML IV SOLN
INTRAVENOUS | Status: DC | PRN
  Administered 2012-12-22: 50 mg via INTRAVENOUS

## 2012-12-22 MED ORDER — VECURONIUM BROMIDE 10 MG IV SOLR
INTRAVENOUS | Status: DC | PRN
  Administered 2012-12-22 (×4): 2 mg via INTRAVENOUS

## 2012-12-22 MED ORDER — HYDROMORPHONE HCL PF 1 MG/ML IJ SOLN
INTRAMUSCULAR | Status: AC
  Filled 2012-12-22: qty 1

## 2012-12-22 MED ORDER — BUPIVACAINE HCL (PF) 0.5 % IJ SOLN
INTRAMUSCULAR | Status: DC | PRN
  Administered 2012-12-22: 20 mL
  Administered 2012-12-22: 5 mL

## 2012-12-22 MED ORDER — VENLAFAXINE HCL ER 150 MG PO CP24
150.0000 mg | ORAL_CAPSULE | Freq: Every day | ORAL | Status: DC
  Administered 2012-12-23: 150 mg via ORAL
  Filled 2012-12-22 (×2): qty 1

## 2012-12-22 MED ORDER — HYDROMORPHONE HCL PF 1 MG/ML IJ SOLN
INTRAMUSCULAR | Status: DC | PRN
  Administered 2012-12-22: 0.5 mg via INTRAVENOUS

## 2012-12-22 MED ORDER — VENLAFAXINE HCL ER 150 MG PO TB24: 150.0000 mg | ORAL_TABLET | Freq: Every day | ORAL | Status: DC

## 2012-12-22 MED ORDER — GLYCOPYRROLATE 0.2 MG/ML IJ SOLN
INTRAMUSCULAR | Status: DC | PRN
  Administered 2012-12-22: 0.6 mg via INTRAVENOUS

## 2012-12-22 MED ORDER — ACETAMINOPHEN 325 MG PO TABS: 650.0000 mg | ORAL_TABLET | ORAL | Status: DC | PRN

## 2012-12-22 MED ORDER — VENLAFAXINE HCL ER 150 MG PO CP24: 150.0000 mg | ORAL_CAPSULE | Freq: Every day | ORAL | Status: DC

## 2012-12-22 SURGICAL SUPPLY — 68 items
BAG DECANTER FOR FLEXI CONT (MISCELLANEOUS) ×2 IMPLANT
BLADE SURG ROTATE 9660 (MISCELLANEOUS) IMPLANT
BUR MATCHSTICK NEURO 3.0 LAGG (BURR) ×2 IMPLANT
CANISTER SUCTION 2500CC (MISCELLANEOUS) ×2 IMPLANT
CLOTH BEACON ORANGE TIMEOUT ST (SAFETY) ×2 IMPLANT
CONT SPEC 4OZ CLIKSEAL STRL BL (MISCELLANEOUS) ×4 IMPLANT
COVER BACK TABLE 24X17X13 BIG (DRAPES) IMPLANT
COVER TABLE BACK 60X90 (DRAPES) ×2 IMPLANT
DECANTER SPIKE VIAL GLASS SM (MISCELLANEOUS) IMPLANT
DERMABOND ADHESIVE PROPEN (GAUZE/BANDAGES/DRESSINGS) ×1
DERMABOND ADVANCED (GAUZE/BANDAGES/DRESSINGS)
DERMABOND ADVANCED .7 DNX12 (GAUZE/BANDAGES/DRESSINGS) IMPLANT
DERMABOND ADVANCED .7 DNX6 (GAUZE/BANDAGES/DRESSINGS) ×1 IMPLANT
DRAPE C-ARM 42X72 X-RAY (DRAPES) ×4 IMPLANT
DRAPE LAPAROTOMY 100X72X124 (DRAPES) ×2 IMPLANT
DRAPE POUCH INSTRU U-SHP 10X18 (DRAPES) ×2 IMPLANT
DRAPE PROXIMA HALF (DRAPES) IMPLANT
DURAPREP 26ML APPLICATOR (WOUND CARE) ×2 IMPLANT
ELECT REM PT RETURN 9FT ADLT (ELECTROSURGICAL) ×2
ELECTRODE REM PT RTRN 9FT ADLT (ELECTROSURGICAL) ×1 IMPLANT
GAUZE SPONGE 4X4 16PLY XRAY LF (GAUZE/BANDAGES/DRESSINGS) IMPLANT
GLOVE BIOGEL PI IND STRL 7.0 (GLOVE) ×1 IMPLANT
GLOVE BIOGEL PI IND STRL 8 (GLOVE) ×2 IMPLANT
GLOVE BIOGEL PI IND STRL 8.5 (GLOVE) ×2 IMPLANT
GLOVE BIOGEL PI INDICATOR 7.0 (GLOVE) ×1
GLOVE BIOGEL PI INDICATOR 8 (GLOVE) ×2
GLOVE BIOGEL PI INDICATOR 8.5 (GLOVE) ×2
GLOVE ECLIPSE 7.5 STRL STRAW (GLOVE) ×6 IMPLANT
GLOVE ECLIPSE 8.5 STRL (GLOVE) ×4 IMPLANT
GLOVE EXAM NITRILE LRG STRL (GLOVE) IMPLANT
GLOVE EXAM NITRILE MD LF STRL (GLOVE) ×2 IMPLANT
GLOVE EXAM NITRILE XL STR (GLOVE) IMPLANT
GLOVE EXAM NITRILE XS STR PU (GLOVE) IMPLANT
GLOVE SURG SS PI 7.0 STRL IVOR (GLOVE) ×6 IMPLANT
GOWN BRE IMP SLV AUR LG STRL (GOWN DISPOSABLE) IMPLANT
GOWN BRE IMP SLV AUR XL STRL (GOWN DISPOSABLE) ×4 IMPLANT
GOWN STRL REIN 2XL LVL4 (GOWN DISPOSABLE) ×6 IMPLANT
KIT BASIN OR (CUSTOM PROCEDURE TRAY) ×2 IMPLANT
KIT ROOM TURNOVER OR (KITS) ×2 IMPLANT
MILL MEDIUM DISP (BLADE) ×2 IMPLANT
NEEDLE HYPO 22GX1.5 SAFETY (NEEDLE) ×2 IMPLANT
NS IRRIG 1000ML POUR BTL (IV SOLUTION) ×2 IMPLANT
PACK FOAM VITOSS 10CC (Orthopedic Implant) ×2 IMPLANT
PACK LAMINECTOMY NEURO (CUSTOM PROCEDURE TRAY) ×2 IMPLANT
PAD ARMBOARD 7.5X6 YLW CONV (MISCELLANEOUS) ×6 IMPLANT
PATTIES SURGICAL .5 X1 (DISPOSABLE) IMPLANT
PEEK PLIF NOVEL 9X25X12 (Peek) ×4 IMPLANT
ROD 35MM (Rod) ×4 IMPLANT
SCREW 40MM (Screw) ×4 IMPLANT
SCREW PEDICLE 45MM (Screw) ×2 IMPLANT
SCREW POLYAX 6.5X45MM (Screw) ×2 IMPLANT
SCREW SET SPINAL STD HEXALOBE (Screw) ×8 IMPLANT
SPONGE GAUZE 4X4 12PLY (GAUZE/BANDAGES/DRESSINGS) ×2 IMPLANT
SPONGE LAP 4X18 X RAY DECT (DISPOSABLE) IMPLANT
SPONGE SURGIFOAM ABS GEL 100 (HEMOSTASIS) ×2 IMPLANT
SUT VIC AB 1 CT1 18XBRD ANBCTR (SUTURE) ×1 IMPLANT
SUT VIC AB 1 CT1 8-18 (SUTURE) ×1
SUT VIC AB 2-0 CP2 18 (SUTURE) ×2 IMPLANT
SUT VIC AB 3-0 SH 8-18 (SUTURE) ×4 IMPLANT
SYR 20ML ECCENTRIC (SYRINGE) ×2 IMPLANT
SYR 3ML LL SCALE MARK (SYRINGE) ×8 IMPLANT
TAPE CLOTH SURG 4X10 WHT LF (GAUZE/BANDAGES/DRESSINGS) ×2 IMPLANT
TOWEL OR 17X24 6PK STRL BLUE (TOWEL DISPOSABLE) ×2 IMPLANT
TOWEL OR 17X26 10 PK STRL BLUE (TOWEL DISPOSABLE) ×2 IMPLANT
TRAP SPECIMEN MUCOUS 40CC (MISCELLANEOUS) ×2 IMPLANT
TRAY FOLEY CATH 14FRSI W/METER (CATHETERS) IMPLANT
TRAY FOLEY CATH 16FRSI W/METER (SET/KITS/TRAYS/PACK) ×2 IMPLANT
WATER STERILE IRR 1000ML POUR (IV SOLUTION) ×2 IMPLANT

## 2012-12-22 NOTE — Anesthesia Preprocedure Evaluation (Signed)
Anesthesia Evaluation  Patient identified by MRN, date of birth, ID band Patient awake    Reviewed: Allergy & Precautions, H&P , NPO status , Patient's Chart, lab work & pertinent test results  History of Anesthesia Complications Negative for: history of anesthetic complications  Airway Mallampati: II TM Distance: >3 FB Neck ROM: Full    Dental  (+) Caps and Dental Advisory Given   Pulmonary former smoker (quit 18 years ago),  breath sounds clear to auscultation  Pulmonary exam normal       Cardiovascular hypertension, Pt. on medications Rhythm:Regular Rate:Normal     Neuro/Psych  Headaches, Chronic back pain: narcotics    GI/Hepatic Neg liver ROS, GERD-  Medicated and Controlled,  Endo/Other  negative endocrine ROS  Renal/GU negative Renal ROS     Musculoskeletal   Abdominal (+) + obese,   Peds  Hematology   Anesthesia Other Findings   Reproductive/Obstetrics                           Anesthesia Physical Anesthesia Plan  ASA: II  Anesthesia Plan: General   Post-op Pain Management:    Induction: Intravenous  Airway Management Planned: Oral ETT  Additional Equipment:   Intra-op Plan:   Post-operative Plan: Extubation in OR  Informed Consent: I have reviewed the patients History and Physical, chart, labs and discussed the procedure including the risks, benefits and alternatives for the proposed anesthesia with the patient or authorized representative who has indicated his/her understanding and acceptance.   Dental advisory given  Plan Discussed with: CRNA and Surgeon  Anesthesia Plan Comments: (Plan routine monitors, GETA)        Anesthesia Quick Evaluation

## 2012-12-22 NOTE — Transfer of Care (Signed)
Immediate Anesthesia Transfer of Care Note  Patient: Chad Foster  Procedure(s) Performed: Procedure(s) with comments: POSTERIOR LUMBAR FUSION 1 LEVEL (N/A) - Lumbar five-Sacral one Decompression/Fusion/posterior lumbar interbody fusion with pedicle screws/cellsaver  Patient Location: PACU  Anesthesia Type:General  Level of Consciousness: awake and alert   Airway & Oxygen Therapy: Patient Spontanous Breathing and Patient connected to nasal cannula oxygen  Post-op Assessment: Report given to PACU RN, Post -op Vital signs reviewed and stable and Patient moving all extremities  Post vital signs: Reviewed and stable  Complications: No apparent anesthesia complications

## 2012-12-22 NOTE — H&P (Signed)
Chad Foster is an 42 y.o. male.   Chief Complaint: back and leg pain from L5 S1 HPI: Chad Foster was seen in August when we planned and did an intradiscal injection in his lumbar spine.  At that time, Chad Foster got a couple of months of relief from his significant low back pain, however, he notes that about a week and a half ago, he rather acutely developed significant pain in the left lower extremity.  Because this was a new and significant change, we have ordered an MRI.    The study demonstrates that Chad Foster has what appears to be a small fragment of disc in the lateral aspect of L5-S1.  This barely lifts the S1 nerve root on the left side, but may be impinging it under the laminar arch.  Chad Foster does have advanced degenerative changes at L5-S1.  He has had a previous right sided laminotomy and discectomy.  The study again demonstrates that his other discs appear to be healthy and normal.  I demonstrated these findings to him, after considering the options he is admitted for surgery to decompress and stabilize  L5 S1.  Past Medical History  Diagnosis Date  . Restless leg   . Back pain   . Insomnia   . Hypertension     takes benicar daily  . Headache(784.0)   . Weakness     bilateral legs d/t back problems  . GERD (gastroesophageal reflux disease)     takes Dexilant daily  . Enlarged prostate     but no meds required at present  . Depression     takes Effexor daily  . Anxiety     takes Xanax daily  . Insomnia     takes Xanax nightly    Past Surgical History  Procedure Laterality Date  . Back surgery      2010, Dr. Danielle Dess, L5-S1  . Cholecystectomy    . Nasal sinus surgery    . Finger fracture surgery Right     reattatched  . Mass excision  06/23/2012    Procedure: EXCISION MASS;  Surgeon: Dalia Heading, MD;  Location: [facility];  Service: General;  Laterality: N/A;  Excision Chest Wall Mass    Family History  Problem Relation Age of Onset  . Cancer - Lung Father    Social History:  reports  that he has quit smoking. His smoking use included Cigars and Cigarettes. He has a 22.5 pack-year smoking history. He does not have any smokeless tobacco history on file. He reports that  drinks alcohol. He reports that he does not use illicit drugs.  Allergies: No Known Allergies  No prescriptions prior to admission    No results found for this or any previous visit (from the past 48 hour(s)). No results found.  Review of Systems  Constitutional: Negative.   HENT: Negative.   Eyes: Negative.   Respiratory: Negative.   Cardiovascular: Negative.   Gastrointestinal: Negative.   Genitourinary: Negative.   Musculoskeletal: Positive for back pain.  Skin: Negative.   Neurological: Positive for tingling and focal weakness.  Endo/Heme/Allergies: Negative.   Psychiatric/Behavioral: Negative.     There were no vitals taken for this visit. Physical Exam  Constitutional: He is oriented to person, place, and time. He appears well-developed and well-nourished.  HENT:  Head: Normocephalic and atraumatic.  Eyes: Conjunctivae are normal. Pupils are equal, round, and reactive to light.  Neck: Normal range of motion. Neck supple.  Cardiovascular: Normal rate, regular rhythm and normal heart  sounds.   Respiratory: Effort normal and breath sounds normal.  GI: Soft. Bowel sounds are normal.  Musculoskeletal: Normal range of motion.  Neurological: He is alert and oriented to person, place, and time. He has normal reflexes.  Positive strait leg raising  Motor function intact.  Skin: Skin is warm and dry.  Psychiatric: He has a normal mood and affect. His behavior is normal. Judgment and thought content normal.     Assessment/Plan Chad Foster does have a long-standing significant back pain that complicates this problem.  He is currently using oxycodone about 10 mg four times a day to get through the day.  He has been considering having a spinal cord stimulator placed and I note that given the fact that he  is having significant radicular pain now with a degenerative process ultimately the best chance for him to get good relief on the long term basis, may be to undergo decompression and stabilization at L5-S1.  I discussed the procedure with him.  This will be done through a posterior approach removing the entirety of the remnants of the disc, placing interbody spacers at L5-S1 to restore the height of the disc space placing bone graft into the disc space.  This is procured from the laminar arch and mixed with a bone supplement in the form of beta-tricalcium phosphate.  Pedicle screws will then be placed at L5-S1 to secure the vertebrae in place.  The surgery is indeed substantial, however, I believe that he has a good chance of getting good relief of his chronic back pain in addition to his radicular pain.  It is because he has only single level disease.  I indicated that the typical hospital stay is between two and four days.  Afterwards, he would require to use a brace when he is up and about for a period of about six weeks.  Thereafter, he would be able to mobilize and get more full mobility and hopefully wean away from his narcotic pain medications.  Beyond that I discussed the consideration of doing a simple discectomy.  My concerns here lie in the fact that he does have significant spondylitic change at L5-S1 already.  He has had a problem with chronic back pain for over a year's period of time with transient relief using an intradiscal injection.  I believe a decompression and fusion is likely to give him much better relief in the long term and would be my recommendation for his care. He is admitted for surgery today.  Zenia Guest J 12/22/2012, 6:44 AM

## 2012-12-22 NOTE — Progress Notes (Signed)
Patient ID: Chad Foster, male   DOB: 04-16-1971, 42 y.o.   MRN: [id number] Incision clean and dry. No complaints. Stable

## 2012-12-22 NOTE — Anesthesia Postprocedure Evaluation (Signed)
  Anesthesia Post-op Note  Patient: Chad Foster  Procedure(s) Performed: Procedure(s) with comments: POSTERIOR LUMBAR FUSION 1 LEVEL (N/A) - Lumbar five-Sacral one Decompression/Fusion/posterior lumbar interbody fusion with pedicle screws/cellsaver  Patient Location: PACU  Anesthesia Type:General  Level of Consciousness: awake, alert , oriented and patient cooperative  Airway and Oxygen Therapy: Patient Spontanous Breathing  Post-op Pain: mild  Post-op Assessment: Post-op Vital signs reviewed, Patient's Cardiovascular Status Stable, Respiratory Function Stable, Patent Airway, No signs of Nausea or vomiting and Pain level controlled  Post-op Vital Signs: Reviewed and stable  Complications: No apparent anesthesia complications

## 2012-12-22 NOTE — Plan of Care (Signed)
Problem: Consults Goal: Diagnosis - Spinal Surgery Outcome: Completed/Met Date Met:  12/22/12 Thoraco/Lumbar Spine Fusion

## 2012-12-22 NOTE — Anesthesia Procedure Notes (Signed)
Procedure Name: Intubation Date/Time: 12/22/2012 11:41 AM Performed by: Luster Landsberg Pre-anesthesia Checklist: Patient identified, Emergency Drugs available, Suction available and Patient being monitored Patient Re-evaluated:Patient Re-evaluated prior to inductionOxygen Delivery Method: Circle system utilized Preoxygenation: Pre-oxygenation with 100% oxygen Intubation Type: IV induction Ventilation: Mask ventilation without difficulty and Oral airway inserted - appropriate to patient size Laryngoscope Size: Mac and 3 Grade View: Grade I Tube type: Oral Tube size: 8.0 mm Number of attempts: 1 Airway Equipment and Method: Stylet Placement Confirmation: ETT inserted through vocal cords under direct vision,  positive ETCO2 and breath sounds checked- equal and bilateral Secured at: 23 cm Tube secured with: Tape Dental Injury: Teeth and Oropharynx as per pre-operative assessment

## 2012-12-22 NOTE — Op Note (Signed)
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 Posterior lumbar interbody arthrodesis with peek spacers local autograft and allograft. Pedicle screw fixation L5-S1. Posterior lateral arthrodesis with local autograft and allograft L5-S1 Surgeon: Barnett Abu Assistant: Wyn Quaker Anesthesia: Gen. endotracheal Indications: Patient is a 42 year old individual who sometime ago at a laminotomy and discectomy L5-S1 on the right. He's had persistence of centralized back pain in addition to central radiculopathy was found to have significant stenosis particularly for the L5 nerve roots and lateral recess addition to the S1 nerve root and particularly on the right side having failed efforts at conservative treatment is advised regarding surgical decompression and stabilization of L5-S1.  Procedure: The patient was brought to the operating room supine on a stretcher. After the smooth induction of general endotracheal anesthesia, he was turned prone. The bony prominences were appropriately padded and protected. The back was prepped with alcohol and DuraPrep. The back was draped in a sterile fashion. A midline incision was created and carried down to the lumbar dorsal fascia. The interlaminar space at L5 and S1 were identified by palpation. This was then confirmed radiographically. A subperiosteal dissection was then performed on either side at L5-S1 to expose the interlaminar space at L5 and the laminar arch of L5. The dissection was carried over the facets to expose transverse process of L5, the ala of the sacrum was also exposed. This area was packed with a singular  sponge for later use as a grafting bed. The surfaces of bone were decorticated. Laminectomy was then created by removing the inferior marginal laminar arch of L5 out to and including the entirety of the facet. The take off of the L5 nerve root superiorly was decompressed of significant redundant ligamentous material causing stenosis into the foramen for the L5 nerve roots on either side. Then the S1 nerve root was decompressed. On the right side there was some pain she'll amount of epidural fibrosis binding the nerve in place. This was causing compression into the foramen. This area was decompressed. On the left side the S1 nerve root was not as severely involved. A laminotomy was nonetheless performed for the S1 nerve root. Then by isolating the disc space, an open the disc space with a 15 blade and use a series of disc rongeurs to evacuate the disc of significant quantity of severely degenerated and desiccated disc material. Interbody spreaders were used to expand the disc space and facilitate removal of severely degenerated disc material from the markedly collapsed disc space particularly on the right side. A discectomy was performed on either side completely so as to allow good grafting surfaces. The endplates were decorticated using a toothed curette. Once the interspace was cleared it was sized for appropriate size spacer. It is felt that a 12 mm standard size lift spacer would fit well and provide expansion of the space and adequate lordosis. These were filled with autograft and allograft and the cost bone sponge. The spacers were then placed into the interspace along with a total of 6 cc of autograft that was harvested from the laminotomies. Once this was completed posterior fusion was facilitated.  Pedicle entry sites were chosen using fluoroscopic guidance and probes were passed into the pedicles of L5 and also the sacrum.  These were then tapped with a 6.5 mm tap, the holes were sounds to check their  integrity, and then 6.5 x 40 mm sacral screws were placed in the sacrum. On the right side at L5-1 6.5 x 45 mm sacral screw was placed on the left side 86.5 x 45 mm standard pedicle screw was placed. Once radiographic confirmation of the screw placement was obtained, we placed 35 mm rods across the pedicle screw faces. Prior to this the lateral gutters which had previously been decorticated and packed off for filled with the remainder of the autograft and allograft combination. The pedicle screw construct was then torqued into a neutral position. Final radiograph confirmation of the placement of the hardware was obtained. When this was verified the wound was carefully inspected for hemostasis care was taken to make sure that the L5 and S1 nerve roots remained well decompressed, closure was then started using #1 Vicryl in an interrupted fashion and the lumbar dorsal fascia, 2-0 Vicryl subcutaneous cutaneous tissues, 3-0 Vicryl subcuticularly. Blood loss was estimated at 250 cc. Of Cell Saver blood was returned. The patient tolerated the procedure well.

## 2012-12-23 LAB — BASIC METABOLIC PANEL
CO2: 29 mEq/L (ref 19–32)
Chloride: 101 mEq/L (ref 96–112)
GFR calc Af Amer: >90 mL/min (ref >90)
Potassium: 3.7 mEq/L (ref 3.5–5.1)
Sodium: 140 mEq/L (ref 135–145)

## 2012-12-23 LAB — CBC
MCV: 91.9 fL (ref 78.0–100.0)
Platelets: 185 10*3/uL (ref 150–400)
RBC: 4.47 MIL/uL (ref 4.22–5.81)
WBC: 9.3 10*3/uL (ref 4.0–10.5)

## 2012-12-23 MED ORDER — METHOCARBAMOL 500 MG PO TABS: 500.0000 mg | ORAL_TABLET | Freq: Four times a day (QID) | ORAL | Status: DC | PRN

## 2012-12-23 NOTE — Evaluation (Signed)
Physical Therapy Evaluation Patient Details Name: Chad Foster MRN: [id number] DOB: [birth year] Today's Date: 12/23/2012 Time: 2130-8657 PT Time Calculation (min): 9 min  PT Assessment / Plan / Recommendation History of Present Illness  Pt  is admitted for surgery to decompress and stabilize  L5 S1. This is his second back surgery and pt has undergone outpatient rehab and is familiar with back precautions and core stabalization.  Pt has a lumbar corset that he is able to apply  Clinical Impression  Pt is doing very well with mobility  post op spinal surgery and is familiar with back precautions and beginning exercises    PT Assessment  Patent does not need any further PT services    Follow Up Recommendations  No PT follow up    Does the patient have the potential to tolerate intense rehabilitation      Barriers to Discharge        Equipment Recommendations  None recommended by PT    Recommendations for Other Services     Frequency      Precautions / Restrictions Precautions Precautions: Back Precaution Booklet Issued: Yes (comment) Precaution Comments: Pt is familiar with back precautions.  Wife asks for "other tips" Restrictions Weight Bearing Restrictions: No   Pertinent Vitals/Pain "it sore"      Mobility  Bed Mobility Details for Bed Mobility Assistance: pt reports he is familiar with log rolling.  He defers practicing. He is more comfortable standing up Transfers Transfers: Sit to Stand;Stand to Sit Details for Transfer Assistance: pt reports some difficulty standing up from a low surface.  We talked about using a bedside commode over toilet to raise surface and provide arm rests to push up on.  Wife works for [employer] and wants to get the [facility] from there Ambulation/Gait Ambulation/Gait Assistance: 7: Independent Ambulation Distance (Feet): 75 Feet Assistive device: None Ambulation/Gait Assistance Details:  Observed pt walking in hallways with wife  with no problems.  Lumbar corset intact Gait Pattern: Within Functional Limits Gait velocity: decreased Stairs: No (pt reports he will have no problems) Wheelchair Mobility Wheelchair Mobility: No    Exercises Other Exercises Other Exercises: reviewed core stabalization; pt reports he is familiar with all exercise from outpatient PT Other Exercises: provided back precaution education sheet and reviewed it.  Pt acknowledged understanding   PT Diagnosis:    PT Problem List:   PT Treatment Interventions:       PT Goals(Current goals can be found in the care plan section)    Visit Information  Last PT Received On: 12/23/12 History of Present Illness: Pt  is admitted for surgery to decompress and stabilize  L5 S1. This is his second back surgery and pt has undergone outpatient rehab and is familiar with back precautions and core stabalization.  Pt has a lumbar corset that he is able to apply       Prior Functioning  Home Living Family/patient expects to be discharged to:: Private residence Living Arrangements: Spouse/significant other Available Help at Discharge: Family Type of Home: House Home Access: Stairs to enter Secretary/administrator of Steps: 2 small Home Layout: One level Home Equipment: None Prior Function Level of Independence: Independent Communication Communication: No difficulties    Cognition  Cognition Arousal/Alertness: Awake/alert Behavior During Therapy: WFL for tasks assessed/performed Overall Cognitive Status: Within Functional Limits for tasks assessed    Extremity/Trunk Assessment Lower Extremity Assessment Lower Extremity Assessment: Overall WFL for tasks assessed Cervical / Trunk Assessment Cervical / Trunk Assessment:  Other exceptions Cervical / Trunk Exceptions: pt with low back spinal sugery   Balance Balance Balance Assessed: Yes Static Standing Balance Static Standing - Balance Support: No upper extremity supported Static Standing -  Level of Assistance: 7: Independent Static Standing - Comment/# of Minutes: >10 min  End of Session PT - End of Session Activity Tolerance: Patient tolerated treatment well Patient left: Other (comment) (standing in room)  GP Functional Limitation: Mobility: Walking and moving around Mobility: Walking and Moving Around Current Status [phone]): At least 1 percent but less than 20 percent impaired, limited or restricted Mobility: Walking and Moving Around Goal Status [phone]): At least 1 percent but less than 20 percent impaired, limited or restricted Mobility: Walking and Moving Around Discharge Status [phone]): At least 1 percent but less than 20 percent impaired, limited or restricted   Rosey Bath K. Manson Passey, PT 12/23/2012, 8:32 AM

## 2012-12-23 NOTE — Progress Notes (Signed)
Pt. Alert and oriented,follows simple instructions, denies pain. Incision area without swelling, redness or S/S of infection. Voiding adequate clear yellow urine. Moving all extremities well and vitals stable and documented. Lumbar surgery notes instructions given to patient and family member for home safety and precautions. Pt. and family stated understanding of instructions given.  

## 2012-12-23 NOTE — Progress Notes (Signed)
Spoke with PT.  Second surgery for pt.  Pt doing well.  No OT needs identified. Tory Emerald, [place] [phone]

## 2012-12-23 NOTE — Discharge Summary (Signed)
Physician Discharge Summary  Patient ID: Chad Foster MRN: [id number] DOB/AGE: 1970/11/19 42 y.o.  Admit date: 12/22/2012 Discharge date: 12/23/2012  Admission Diagnoses:Spondylosis with radiculopathy L5-S1 left  Discharge Diagnoses: Spondylosis with radiculopathy L5-S1 left Active Problems:   * No active hospital problems. *   Discharged Condition: good  Hospital Course: Admitted for surgery, tolerated without difficulty  Consults: None  Significant Diagnostic Studies: none  Treatments: decompression, fusion L5 S1  Discharge Exam: Blood pressure 122/84, pulse 83, temperature 98.6 F (37 C), temperature source Oral, resp. rate 18, SpO2 98.00%. incision clean and dry, motor function intact  Disposition: HOME  Discharge Orders   Future Appointments Provider Department Dept Phone   02/10/2013 1:30 PM Babs Sciara, MD [facility] [phone]   Future Orders Complete By Expires     Call MD for:  redness, tenderness, or signs of infection (pain, swelling, redness, odor or green/yellow discharge around incision site)  As directed     Call MD for:  severe uncontrolled pain  As directed     Call MD for:  temperature >100.4  As directed     Diet - low sodium heart healthy  As directed     Discharge instructions  As directed     Comments:      Okay to shower. Do not apply salves or appointments to incision. No heavy lifting with the upper extremities greater than 15 pounds. May resume driving when not requiring pain medication and patient feels comfortable with doing so.    Increase activity slowly  As directed         Medication List         ALPRAZolam 1 MG tablet  Commonly known as:  XANAX  Take 1 mg by mouth 3 (three) times daily as needed for sleep.     celecoxib 200 MG capsule  Commonly known as:  CELEBREX  Take 1 capsule (200 mg total) by mouth 2 (two) times daily as needed. For pain     dexlansoprazole 60 MG capsule  Commonly known as:   DEXILANT  Take 1 capsule (60 mg total) by mouth daily.     diclofenac sodium 1 % Gel  Commonly known as:  VOLTAREN  Apply 4 g topically 4 (four) times daily.     lidocaine 5 %  Commonly known as:  LIDODERM  Place 1 patch onto the skin daily. Remove & Discard patch within 12 hours or as directed by MD     methocarbamol 500 MG tablet  Commonly known as:  ROBAXIN  Take 1 tablet (500 mg total) by mouth every 6 (six) hours as needed.     olmesartan 40 MG tablet  Commonly known as:  BENICAR  Take 1 tablet (40 mg total) by mouth at bedtime.     oxyCODONE-acetaminophen 10-325 MG per tablet  Commonly known as:  PERCOCET  Take 1-1.5 tablets by mouth 4 (four) times daily.     pramipexole 1 MG tablet  Commonly known as:  MIRAPEX  Take 1 tablet (1 mg total) by mouth 3 (three) times daily.     sildenafil 100 MG tablet  Commonly known as:  VIAGRA  Take 1 tablet (100 mg total) by mouth as needed for erectile dysfunction.     venlafaxine XR 150 MG 24 hr capsule  Commonly known as:  EFFEXOR-XR  Take 1 capsule (150 mg total) by mouth daily.         SignedStefani Dama 12/23/2012, 8:01 AM

## 2012-12-25 MED FILL — Sodium Chloride IV Soln 0.9%: INTRAVENOUS | Qty: 1000 | Status: AC

## 2012-12-25 MED FILL — Heparin Sodium (Porcine) Inj 1000 Unit/ML: INTRAMUSCULAR | Qty: 30 | Status: AC

## 2012-12-29 DIAGNOSIS — Z0289 Encounter for other administrative examinations: Secondary | ICD-10-CM

## 2013-01-09 ENCOUNTER — Ambulatory Visit (INDEPENDENT_AMBULATORY_CARE_PROVIDER_SITE_OTHER): Payer: BC Managed Care – PPO | Admitting: Psychology

## 2013-01-09 DIAGNOSIS — F411 Generalized anxiety disorder: Secondary | ICD-10-CM

## 2013-01-09 DIAGNOSIS — G894 Chronic pain syndrome: Secondary | ICD-10-CM

## 2013-01-09 DIAGNOSIS — F329 Major depressive disorder, single episode, unspecified: Secondary | ICD-10-CM

## 2013-01-09 DIAGNOSIS — F419 Anxiety disorder, unspecified: Secondary | ICD-10-CM

## 2013-01-15 ENCOUNTER — Encounter (HOSPITAL_COMMUNITY): Payer: Self-pay | Admitting: Psychology

## 2013-01-15 NOTE — Progress Notes (Signed)
Patient:  Chad Foster   DOB: Dec 23, 1970  MR Number: [id number]  Location: [facility] [address] [place] [address] Dept: [phone]  Start: 3 PM End: 4 PM  Provider/Observer:     Hershal Coria PSYD  Chief Complaint:      Chief Complaint  Patient presents with  . Anxiety  . Agitation  . Stress    Reason For Service:     The patient was referred by Dr. Andrey Campanile because of issues related to depression after being out of work due to severe back pain. The patient reports that he had disc rupture and subsequent surgery in L5/S1. The patient recuperated from this for the most part and try to go back to work. He began developing more and more pain and saw Dr. Danielle Dess again. He has been having increasing pain difficulties and has missed a lot of work and been limited as far as his physical activity. He has also received spinal injections as well. The patient reports that there is no other surgery possibility. The patient has had issues with depression to some degree of the past 10 years and has been treated with both Paxil and Effexor in the past. The patient did have a time where he stopped taking these for one week and had a significant setback from his depression symptoms and is back taking the SSRI medications again.    Interventions Strategy:  Cognitive/behavioral psychotherapeutic interventions  Participation Level:   Active  Participation Quality:  Appropriate      Behavioral Observation:  Well Groomed, Alert, and Appropriate.   Current Psychosocial Factors: The patient reports that he has had his surgery performed by Dr. Cherie Dark and that he is recovering quite well. He has had a lot of difficulties coping with the interactions between the neurosurgical practice and his pain management doctors and he is really hoping that he is going to be able to completely stop narcotic pain  medications. The patient reports he has made significant improvement in his pain symptoms and he is following the doctors recommendations following surgery.  Content of Session:   Reviewed current symptoms and continue to work on building better coping skills and strategies around issues of his symptoms of depression.  Current Status:   The patient reports that he is doing much better and is quite helpful at this point that this will continue to improve as his physical status improves. He had a fusion done in from all points observation the patient reports he feels like it was accessible.  Patient Progress:   Stable  Goals Addressed Today:    Goals addressed today have to do with increasing his coping skills and strategies around issues of recurrent depression and better dealing with when he is currently going through.  Impression/Diagnosis:   At this point, the patient does have some significant residual pain problems from his ruptured disc. He is having difficulty maintaining his work and is struggling financially and dealing with not being able to work like he had been. At this point, there does appear to be significant symptoms of depression.   Diagnosis:    Axis I:  Depressive disorder  Anxiety  Chronic pain syndrome      Axis II: No diagnosis

## 2013-02-09 ENCOUNTER — Ambulatory Visit (HOSPITAL_COMMUNITY): Payer: Self-pay | Admitting: Psychology

## 2013-02-10 ENCOUNTER — Ambulatory Visit: Payer: BC Managed Care – PPO | Admitting: Family Medicine

## 2013-04-09 ENCOUNTER — Other Ambulatory Visit: Payer: Self-pay

## 2013-06-11 ENCOUNTER — Other Ambulatory Visit: Payer: Self-pay | Admitting: Family Medicine

## 2013-06-12 NOTE — Telephone Encounter (Signed)
Send pharmacy in no letting them know this. So therefore we will be refilling it. If he is for some reason and staying with [place] he can have 30 days and would need a followup office visit.

## 2013-09-12 IMAGING — CR DG FOOT COMPLETE 3+V*R*
3 series · 3 of 3 positions shown · non-contrast
Comparison: None

CLINICAL DATA: Right foot injury and pain.

RIGHT FOOT COMPLETE - 3+ VIEW

[view not recorded (1 of 3)]
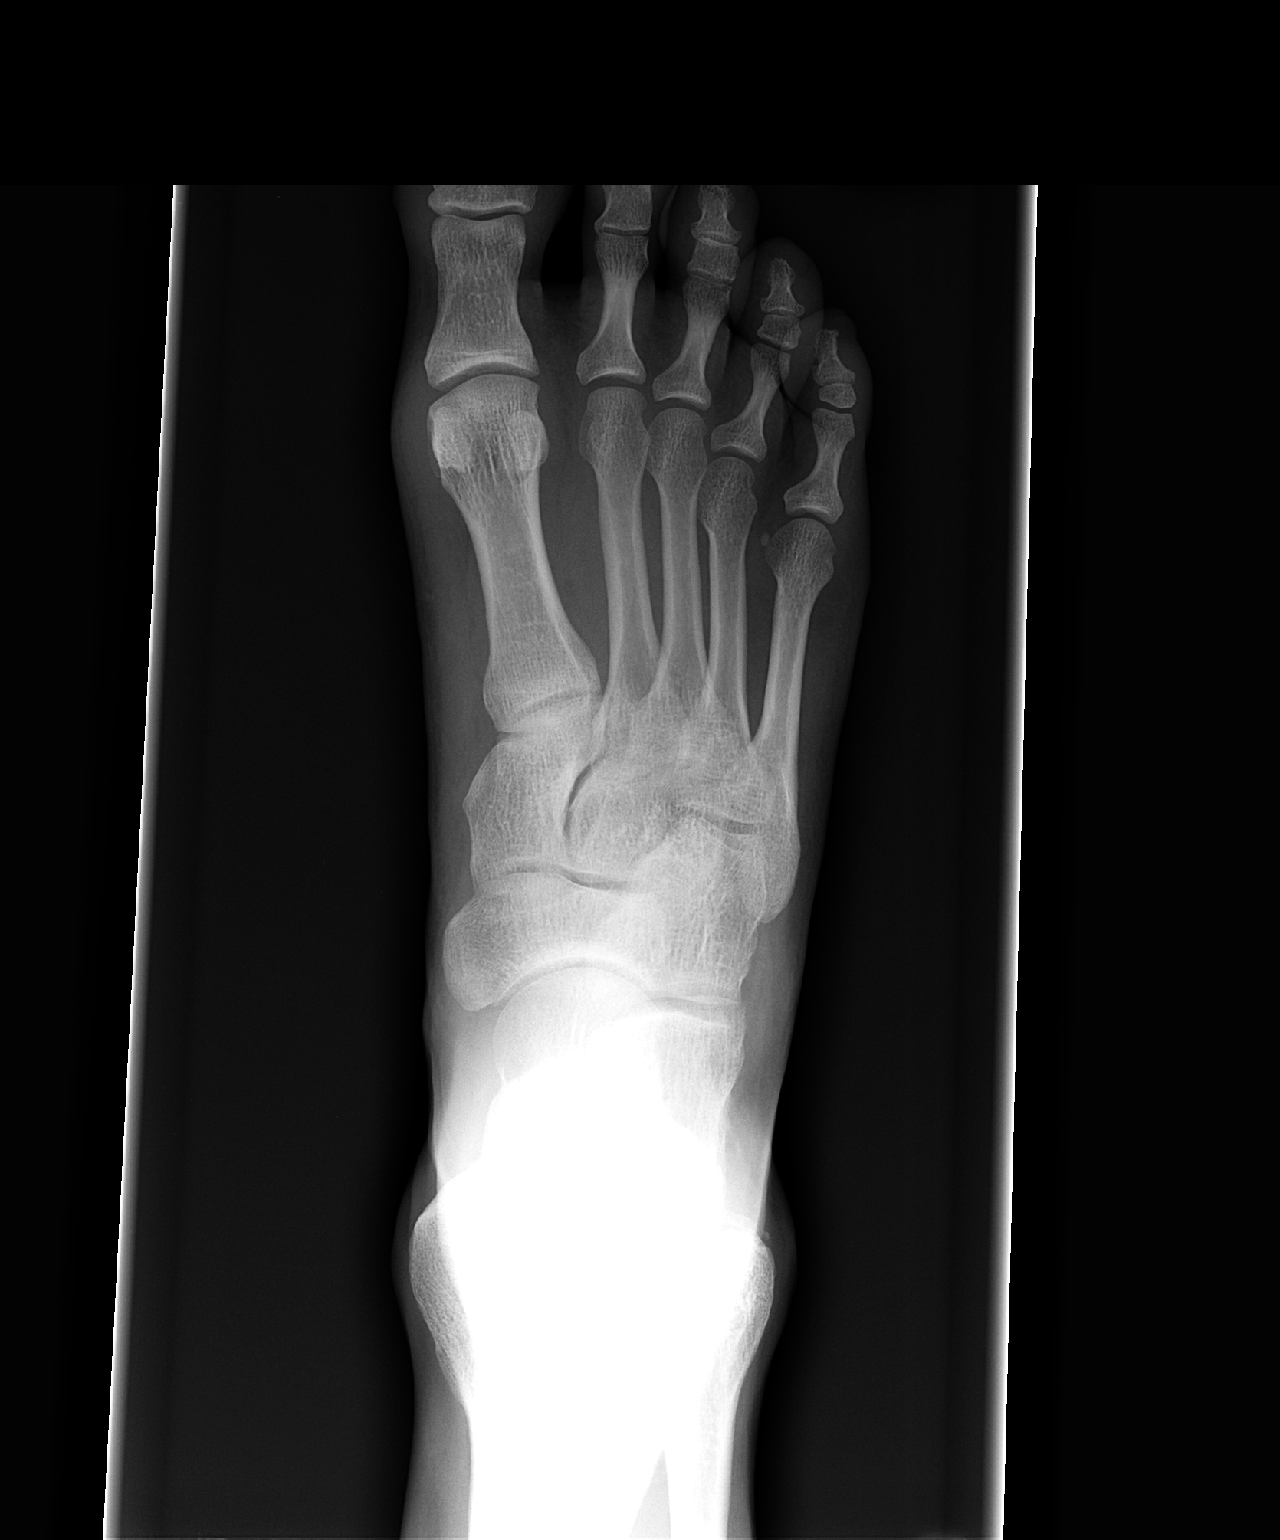

[view not recorded (2 of 3)]
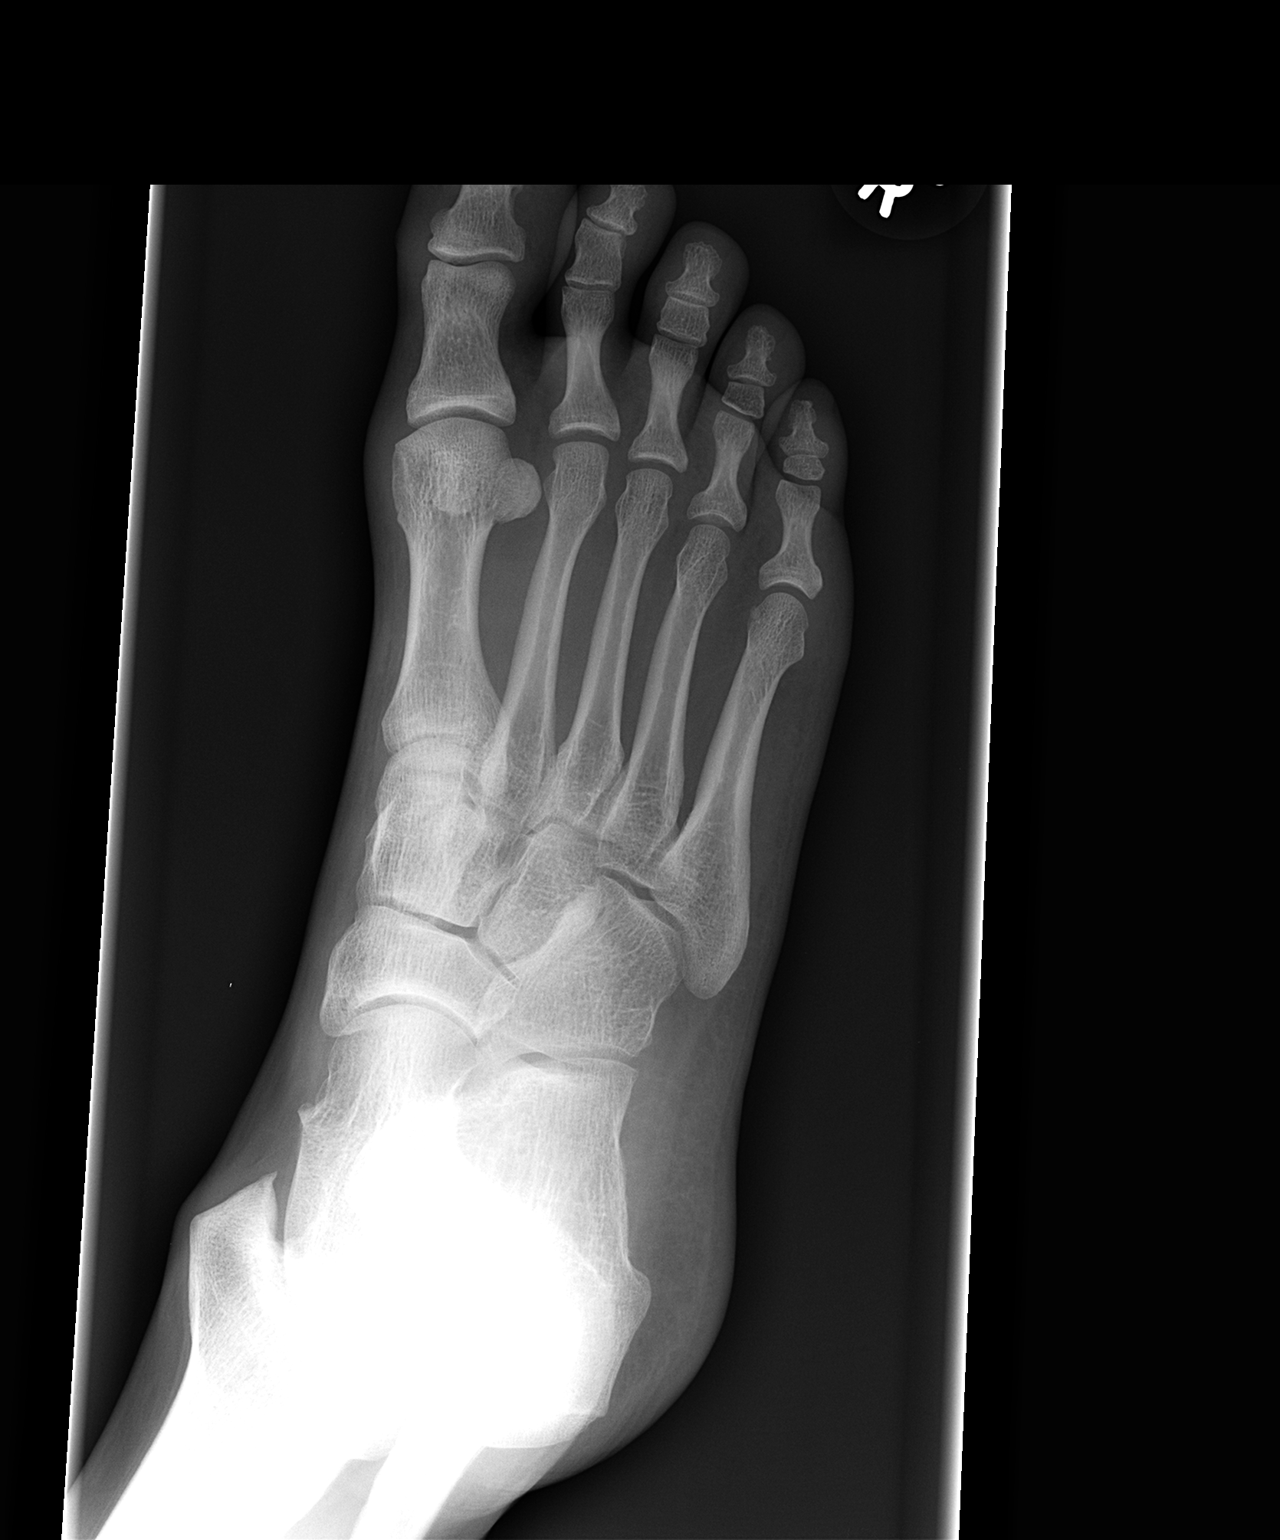

[view not recorded (3 of 3)]
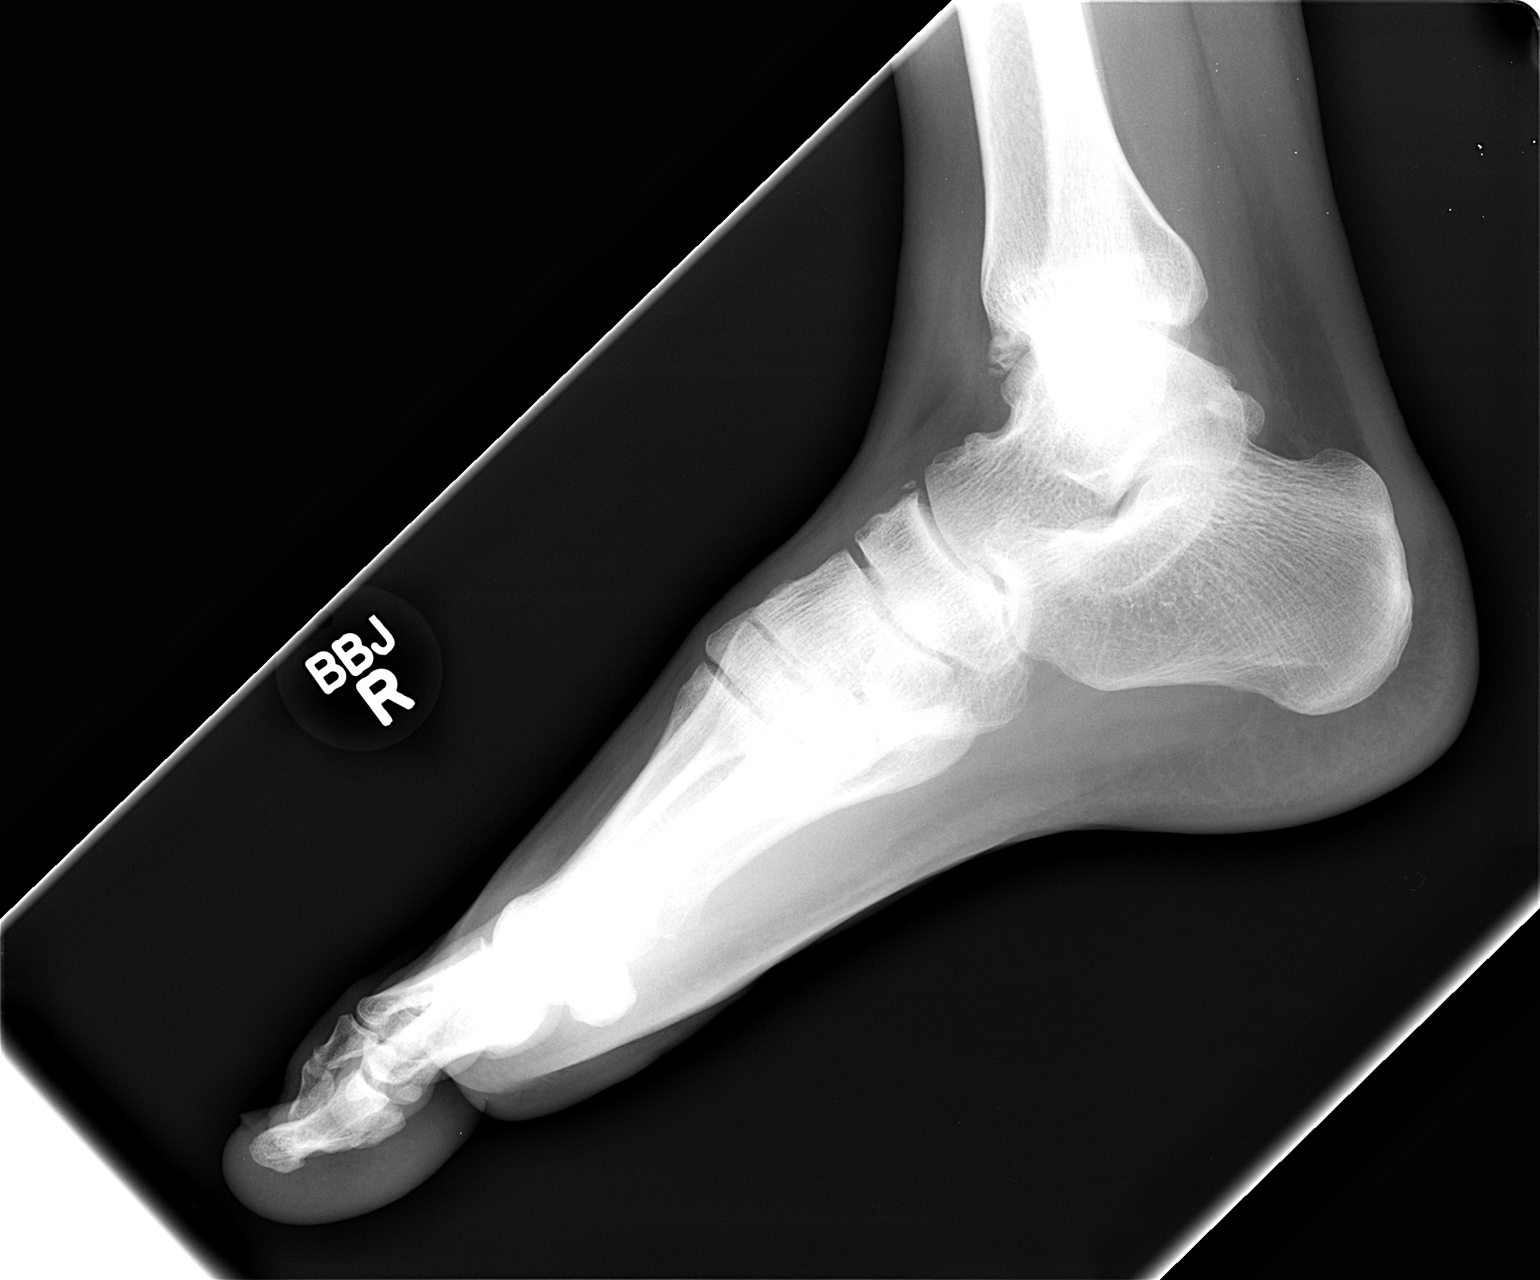

[3 of 3 positions shown; findings below may reference images not displayed]

FINDINGS: There is no evidence of acute fracture, subluxation, or
dislocation.
The Lisfranc joints are intact.
No focal bony lesions are identified.
There is no evidence of radiopaque foreign body.

The joint spaces are unremarkable.
IMPRESSION: No acute bony abnormalities.

## 2013-09-12 IMAGING — CR DG ANKLE COMPLETE 3+V*R*
3 series · 3 of 3 positions shown · non-contrast
Comparison: None

CLINICAL DATA: Right ankle injury and pain.

RIGHT ANKLE - COMPLETE 3+ VIEW

[view not recorded (1 of 3)]
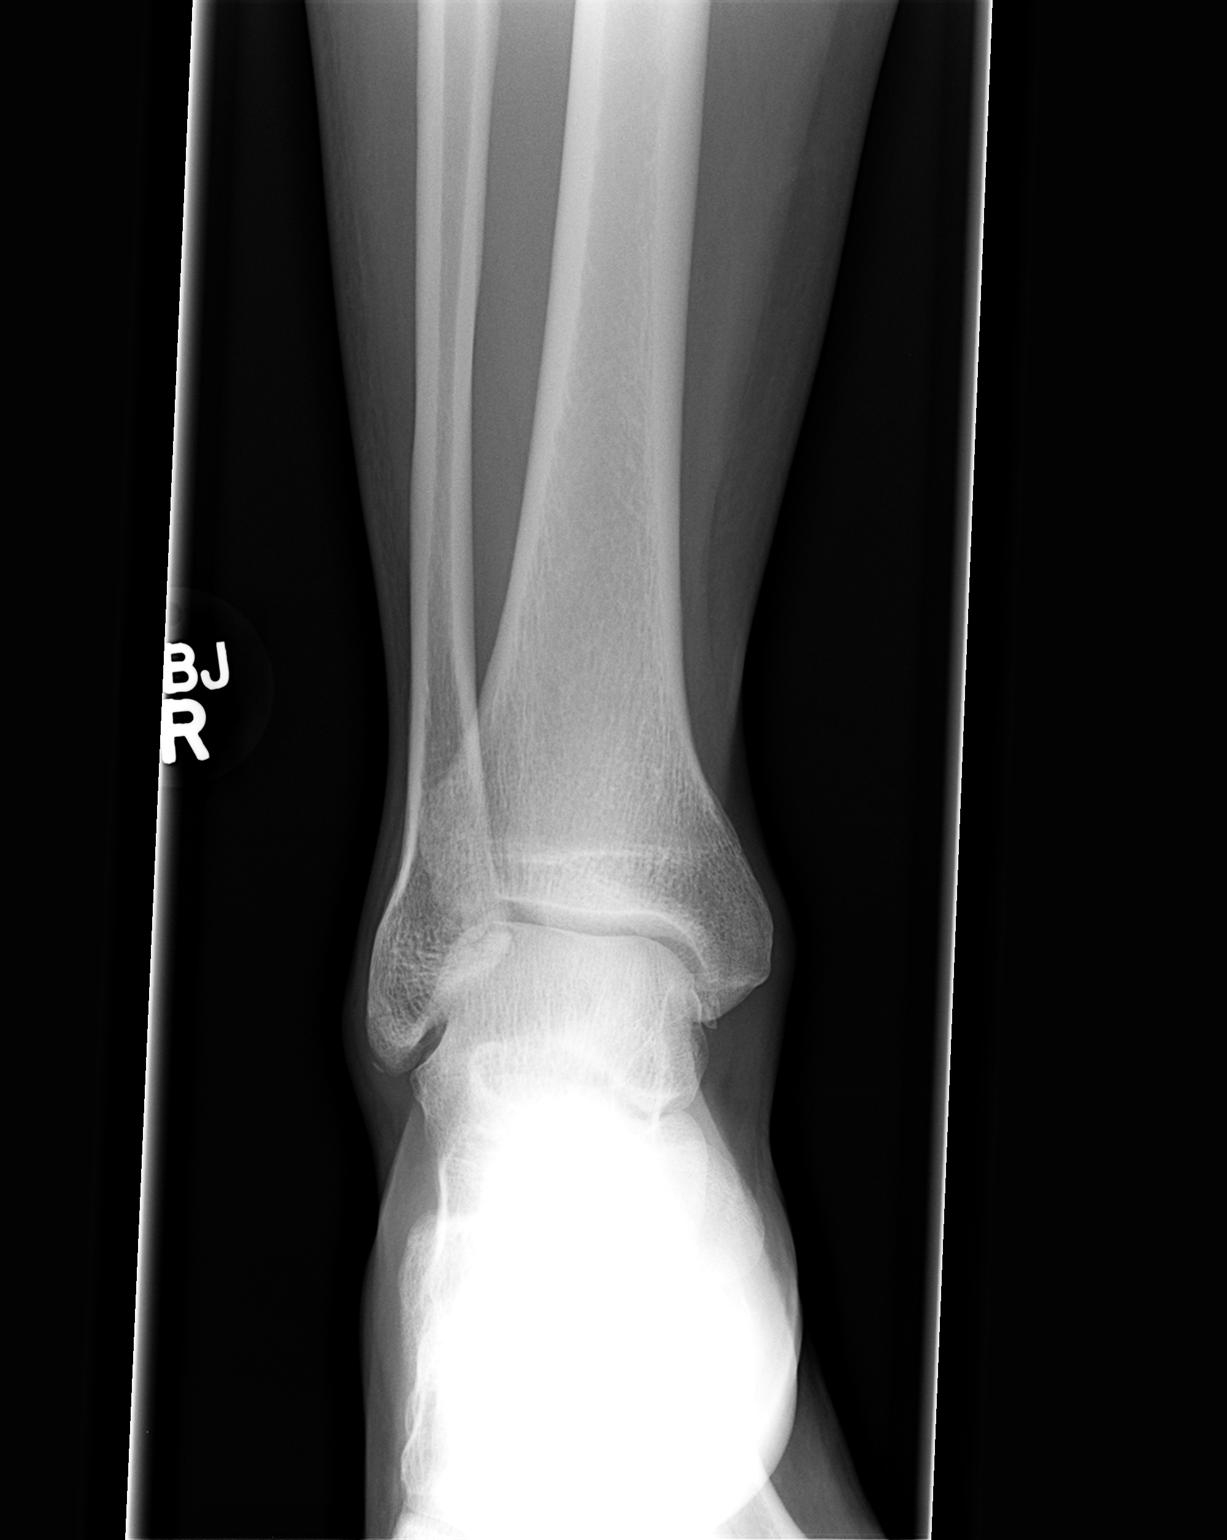

[view not recorded (2 of 3)]
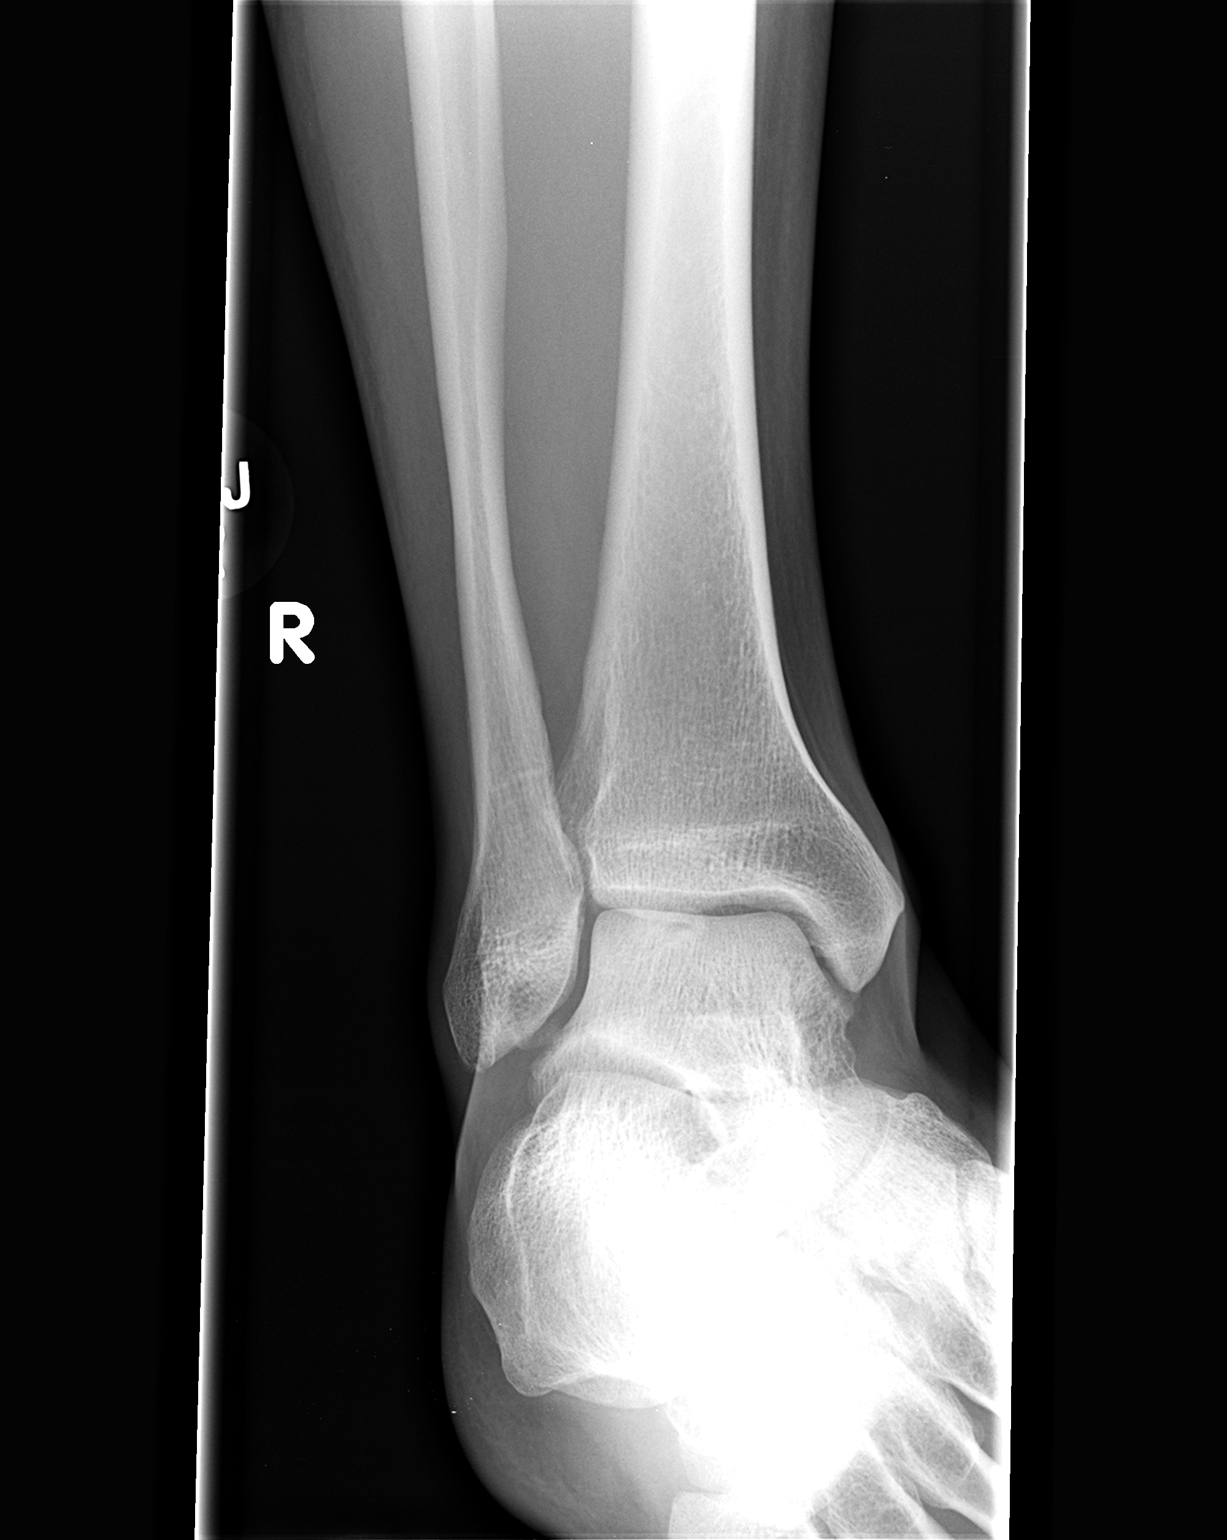

[view not recorded (3 of 3)]
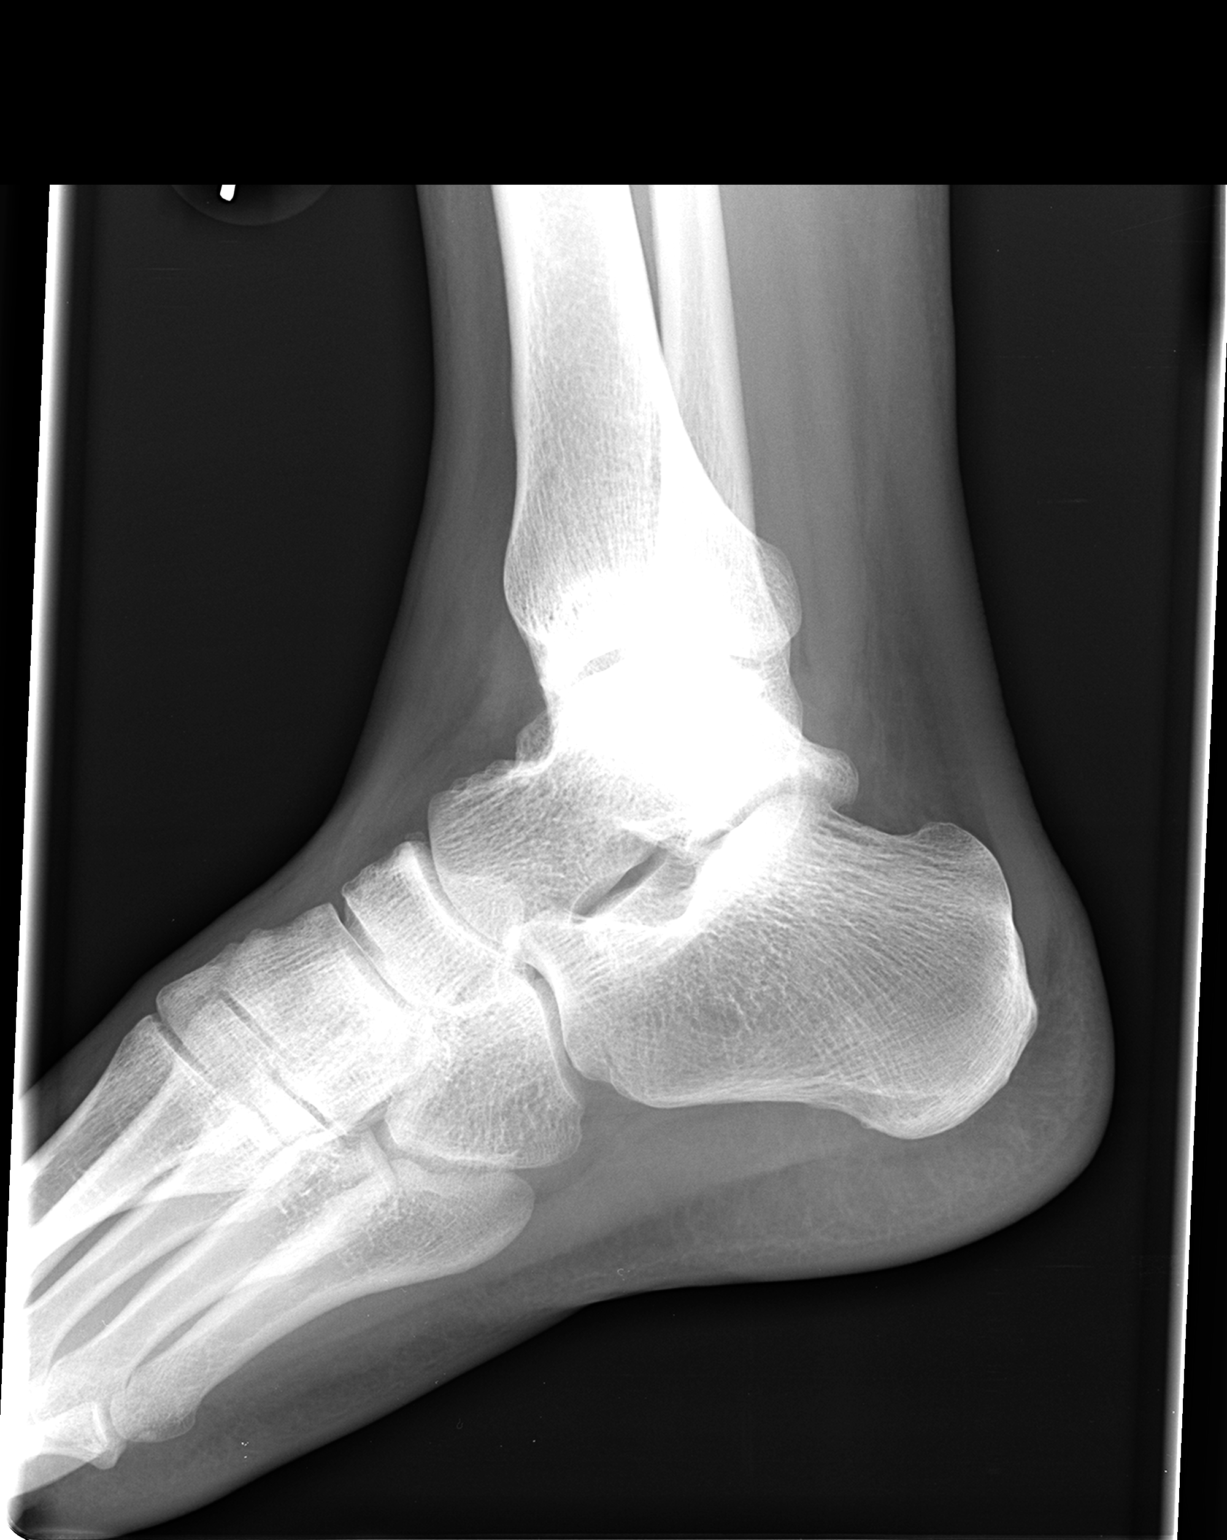

[3 of 3 positions shown; findings below may reference images not displayed]

FINDINGS: A tiny bony density along the fibular tip is identified
without overlying soft tissue swelling.
There is no evidence of subluxation or dislocation.
No other bony abnormalities are present.
The joint spaces are unremarkable.
IMPRESSION: A tiny bony density along the fibular tip without overlying soft
tissue swelling.  Fracture not excluded - correlate clinically.

## 2013-09-22 ENCOUNTER — Other Ambulatory Visit: Payer: Self-pay | Admitting: *Deleted

## 2013-09-22 MED ORDER — OLMESARTAN MEDOXOMIL 40 MG PO TABS: 40.0000 mg | ORAL_TABLET | Freq: Every day | ORAL | Status: DC

## 2013-11-24 ENCOUNTER — Ambulatory Visit (INDEPENDENT_AMBULATORY_CARE_PROVIDER_SITE_OTHER): Payer: BC Managed Care – PPO | Admitting: Psychology

## 2013-11-24 DIAGNOSIS — F329 Major depressive disorder, single episode, unspecified: Secondary | ICD-10-CM

## 2013-11-24 DIAGNOSIS — F419 Anxiety disorder, unspecified: Secondary | ICD-10-CM

## 2013-11-24 DIAGNOSIS — F32A Depression, unspecified: Secondary | ICD-10-CM

## 2013-11-24 DIAGNOSIS — F3289 Other specified depressive episodes: Secondary | ICD-10-CM

## 2013-11-24 DIAGNOSIS — F411 Generalized anxiety disorder: Secondary | ICD-10-CM

## 2013-11-24 DIAGNOSIS — G894 Chronic pain syndrome: Secondary | ICD-10-CM

## 2013-11-26 ENCOUNTER — Encounter (HOSPITAL_COMMUNITY): Payer: Self-pay | Admitting: Psychology

## 2013-11-26 NOTE — Progress Notes (Signed)
   PROGRESS NOTE  Patient:  Chad Foster   DOB: [birth year]  MR Number: [id number]  Location: [facility] [address] [place] [address] Dept: [phone]  Start: 3 PM End: 4 PM  Smita Lesh/Observer:     Edgardo Roys PSYD  Chief Complaint:      Chief Complaint  Patient presents with  . Stress  . Depression  . Anxiety    Reason For Service:     The patient was referred by Dr. Redmond Pulling because of issues related to depression after being out of work due to severe back pain. The patient reports that he had disc rupture and subsequent surgery in L5/S1. The patient recuperated from this for the most part and try to go back to work. He began developing more and more pain and saw Dr. Ellene Route again. He has been having increasing pain difficulties and has missed a lot of work and been limited as far as his physical activity. He has also received spinal injections as well. The patient reports that there is no other surgery possibility. The patient has had issues with depression to some degree of the past 10 years and has been treated with both Paxil and Effexor in the past. The patient did have a time where he stopped taking these for one week and had a significant setback from his depression symptoms and is back taking the SSRI medications again.    Interventions Strategy:  Cognitive/behavioral psychotherapeutic interventions  Participation Level:   Active  Participation Quality:  Appropriate      Behavioral Observation:  Well Groomed, Alert, and Appropriate.   Current Psychosocial Factors: The patient returns after about 10 months.  He reports that his back is still healing but there has only been abou 25% of the bone growth needed so far.  The patient report he continues with significan pain (but better than before surgery).  The patient reports that he still has significan limitations.  He has  tried to find work, but his limitations have kept him from getting hired.  The patient reports that he feels he is letting his family down and that his wife is disappointed in him but he cant do much.  Content of Session:   Reviewed current symptoms and continue to work on building better coping skills and strategies around issues of his symptoms of depression.  Current Status:   The patient reports that while he has improved from the worse state he was in, he has still not been able to work or find any work that he can get hired for.  His depression remains and has a very difficult time doing things around the house and for family.  Patient Progress:   Stable  Goals Addressed Today:    Goals addressed today have to do with increasing his coping skills and strategies around issues of recurrent depression and better dealing with when he is currently going through.  Impression/Diagnosis:   At this point, the patient does have some significant residual pain problems from his ruptured disc. He is having difficulty maintaining his work and is struggling financially and dealing with not being able to work like he had been. At this point, there does appear to be significant symptoms of depression.   Diagnosis:    Axis I:  Depressive disorder  Anxiety  Chronic pain syndrome        RODENBOUGH,JOHN R, PsyD 11/26/2013

## 2013-12-16 ENCOUNTER — Ambulatory Visit (INDEPENDENT_AMBULATORY_CARE_PROVIDER_SITE_OTHER): Payer: BC Managed Care – PPO | Admitting: Psychology

## 2013-12-16 DIAGNOSIS — F329 Major depressive disorder, single episode, unspecified: Secondary | ICD-10-CM

## 2013-12-16 DIAGNOSIS — F32A Depression, unspecified: Secondary | ICD-10-CM

## 2013-12-16 DIAGNOSIS — F419 Anxiety disorder, unspecified: Secondary | ICD-10-CM

## 2013-12-16 DIAGNOSIS — F411 Generalized anxiety disorder: Secondary | ICD-10-CM

## 2013-12-16 DIAGNOSIS — G894 Chronic pain syndrome: Secondary | ICD-10-CM

## 2013-12-16 DIAGNOSIS — F3289 Other specified depressive episodes: Secondary | ICD-10-CM

## 2013-12-17 ENCOUNTER — Other Ambulatory Visit: Payer: Self-pay | Admitting: Family Medicine

## 2014-02-04 ENCOUNTER — Encounter (HOSPITAL_COMMUNITY): Payer: Self-pay | Admitting: Psychology

## 2014-02-04 NOTE — Progress Notes (Signed)
   PROGRESS NOTE  Patient:  Chad Foster   DOB: 11-Mar-1971  MR Number: [id number]  Location: [facility] [address] [place] [address] Dept: [phone]  Start: 2 PM End: 3 PM  Provider/Observer:     Edgardo Roys PSYD  Chief Complaint:      Chief Complaint  Patient presents with  . Depression  . Anxiety  . Agitation  . Stress    Reason For Service:     The patient was referred by Dr. Redmond Pulling because of issues related to depression after being out of work due to severe back pain. The patient reports that he had disc rupture and subsequent surgery in L5/S1. The patient recuperated from this for the most part and try to go back to work. He began developing more and more pain and saw Dr. Ellene Route again. He has been having increasing pain difficulties and has missed a lot of work and been limited as far as his physical activity. He has also received spinal injections as well. The patient reports that there is no other surgery possibility. The patient has had issues with depression to some degree of the past 10 years and has been treated with both Paxil and Effexor in the past. The patient did have a time where he stopped taking these for one week and had a significant setback from his depression symptoms and is back taking the SSRI medications again.    Interventions Strategy:  Cognitive/behavioral psychotherapeutic interventions  Participation Level:   Active  Participation Quality:  Appropriate      Behavioral Observation:  Well Groomed, Alert, and Appropriate.   Current Psychosocial Factors: The patient reports that stressors between he and his wife continue to be significant. He reports that his severe physical limitations her having a major deleterious effect on his relationship with his wife in his ability to interact and maintained 5.  Content of Session:   Reviewed current  symptoms and continue to work on building better coping skills and strategies around issues of his symptoms of depression.  Current Status:   The patient reports that while ability to cope with them deal with his current situation is improved he continues to have severe physical limitations. Offices continue to be very frustrating to him his mood has been better and he is cope with the severe limitations better. However, the relationship between he and his wife continues to strained and dysfunctional at times.  Patient Progress:   Stable  Goals Addressed Today:    Goals addressed today have to do with increasing his coping skills and strategies around issues of recurrent depression and better dealing with when he is currently going through.  Impression/Diagnosis:   At this point, the patient does have some significant residual pain problems from his ruptured disc. He is having difficulty maintaining his work and is struggling financially and dealing with not being able to work like he had been. At this point, there does appear to be significant symptoms of depression.   Diagnosis:    Axis I:  Depressive disorder  Anxiety  Chronic pain syndrome

## 2014-02-23 ENCOUNTER — Encounter: Payer: Self-pay | Admitting: Neurology

## 2014-02-23 ENCOUNTER — Ambulatory Visit (INDEPENDENT_AMBULATORY_CARE_PROVIDER_SITE_OTHER): Payer: BC Managed Care – PPO | Admitting: Neurology

## 2014-02-23 VITALS — BP 117/79 | HR 80 | Temp 97.6°F | Ht 70.0 in | Wt 224.0 lb

## 2014-02-23 DIAGNOSIS — G478 Other sleep disorders: Secondary | ICD-10-CM

## 2014-02-23 DIAGNOSIS — G4710 Hypersomnia, unspecified: Secondary | ICD-10-CM

## 2014-02-23 DIAGNOSIS — G4761 Periodic limb movement disorder: Secondary | ICD-10-CM

## 2014-02-23 DIAGNOSIS — G2581 Restless legs syndrome: Secondary | ICD-10-CM

## 2014-02-23 DIAGNOSIS — R400 Somnolence: Secondary | ICD-10-CM

## 2014-02-23 DIAGNOSIS — R0989 Other specified symptoms and signs involving the circulatory and respiratory systems: Secondary | ICD-10-CM

## 2014-02-23 DIAGNOSIS — R0609 Other forms of dyspnea: Secondary | ICD-10-CM

## 2014-02-23 DIAGNOSIS — G8929 Other chronic pain: Secondary | ICD-10-CM

## 2014-02-23 DIAGNOSIS — R0683 Snoring: Secondary | ICD-10-CM

## 2014-02-23 DIAGNOSIS — M549 Dorsalgia, unspecified: Secondary | ICD-10-CM
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
## 2014-02-23 NOTE — Patient Instructions (Addendum)
We will do a sleep study to look for obstructive sleep apnea and leg movements since you have RLS symptoms.  Based on your symptoms and your exam I believe you are at risk for obstructive sleep apnea or OSA, and I think we should proceed with a sleep study to determine whether you do or do not have OSA and how severe it is. If you have more than mild OSA, I want you to consider treatment with CPAP. Please remember, the risks and ramifications of moderate to severe obstructive sleep apnea or OSA are: Cardiovascular disease, including congestive heart failure, stroke, difficult to control hypertension, arrhythmias, and even type 2 diabetes has been linked to untreated OSA. Sleep apnea causes disruption of sleep and sleep deprivation in most cases, which, in turn, can cause recurrent headaches, problems with memory, mood, concentration, focus, and vigilance. Most people with untreated sleep apnea report excessive daytime sleepiness, which can affect their ability to drive. Please do not drive if you feel sleepy.  I will see you back after your sleep study to go over the test results and where to go from there. We will call you after your sleep study and to set up an appointment at the time.

## 2014-02-24 ENCOUNTER — Ambulatory Visit (INDEPENDENT_AMBULATORY_CARE_PROVIDER_SITE_OTHER): Payer: BC Managed Care – PPO | Admitting: Psychology

## 2014-02-24 DIAGNOSIS — F32A Depression, unspecified: Secondary | ICD-10-CM

## 2014-02-24 DIAGNOSIS — F419 Anxiety disorder, unspecified: Secondary | ICD-10-CM

## 2014-02-24 DIAGNOSIS — F411 Generalized anxiety disorder: Secondary | ICD-10-CM

## 2014-02-24 DIAGNOSIS — G894 Chronic pain syndrome: Secondary | ICD-10-CM

## 2014-02-24 DIAGNOSIS — F329 Major depressive disorder, single episode, unspecified: Secondary | ICD-10-CM

## 2014-02-24 DIAGNOSIS — F3289 Other specified depressive episodes: Secondary | ICD-10-CM

## 2014-03-31 ENCOUNTER — Ambulatory Visit (INDEPENDENT_AMBULATORY_CARE_PROVIDER_SITE_OTHER): Payer: BC Managed Care – PPO | Admitting: Neurology

## 2014-03-31 VITALS — BP 113/75

## 2014-03-31 DIAGNOSIS — G4710 Hypersomnia, unspecified: Secondary | ICD-10-CM

## 2014-03-31 DIAGNOSIS — G479 Sleep disorder, unspecified: Secondary | ICD-10-CM

## 2014-03-31 DIAGNOSIS — G4761 Periodic limb movement disorder: Secondary | ICD-10-CM

## 2014-03-31 DIAGNOSIS — G4730 Sleep apnea, unspecified: Secondary | ICD-10-CM

## 2014-03-31 DIAGNOSIS — G2581 Restless legs syndrome: Secondary | ICD-10-CM

## 2014-03-31 DIAGNOSIS — G4733 Obstructive sleep apnea (adult) (pediatric): Secondary | ICD-10-CM

## 2014-03-31 NOTE — Sleep Study (Signed)
Please see the scanned sleep study interpretation located in the Procedure tab within the Chart Review section. 

## 2014-04-09 ENCOUNTER — Telehealth: Payer: Self-pay | Admitting: Neurology

## 2014-04-09 DIAGNOSIS — G4733 Obstructive sleep apnea (adult) (pediatric): Secondary | ICD-10-CM

## 2014-04-09 NOTE — Telephone Encounter (Signed)
Please call and notify patient that the recent sleep study confirmed the diagnosis of OSA. She did adequately with CPAP during the study with improvement of the respiratory events, but not full resolution. Therefore, I would like start the patient on an autoPAP at home. I placed the order in the chart.   Arrange for PAP set up at home through a DME company of patient's choice and fax/route report to PCP and referring MD (if other than PCP).   The patient will also need a follow up appointment with me in 6-8 weeks post set up that has to be scheduled; help the patient schedule this (in a follow-up slot).   Please re-enforce the importance of compliance with treatment and the need for [place] to monitor compliance data.   Once you have spoken to the patient and scheduled the return appointment, you may close this encounter, thanks,   Star Age, MD, PhD [facility] ([place])

## 2014-04-12 ENCOUNTER — Encounter: Payer: Self-pay | Admitting: *Deleted

## 2014-04-12 NOTE — Telephone Encounter (Signed)
Patient was contacted and provided the results of his overnight sleep study which revealed sleep apnea.  Patient was referred to [facility] of CPAP set up by request.  Patient was mailed a copy of the results and Collene Mares was faxed a copy of the report.   Patient instructed to contact our office 6-8 weeks post set up to schedule a follow up appointment.

## 2014-04-21 ENCOUNTER — Encounter (HOSPITAL_COMMUNITY): Payer: Self-pay | Admitting: Psychology

## 2014-04-21 NOTE — Progress Notes (Signed)
    PROGRESS NOTE  Patient:  Chad Foster   DOB: 12-04-1970  MR Number: [id number]  Location: [facility] [address] [place] [address] Dept: [phone]  Start: 2 PM End: 3 PM  Provider/Observer:     Edgardo Roys PSYD  Chief Complaint:      Chief Complaint  Patient presents with  . Stress  . Agitation  . Anxiety  . Depression    Reason For Service:     The patient was referred by Dr. Redmond Pulling because of issues related to depression after being out of work due to severe back pain. The patient reports that he had disc rupture and subsequent surgery in L5/S1. The patient recuperated from this for the most part and try to go back to work. He began developing more and more pain and saw Dr. Ellene Route again. He has been having increasing pain difficulties and has missed a lot of work and been limited as far as his physical activity. He has also received spinal injections as well. The patient reports that there is no other surgery possibility. The patient has had issues with depression to some degree of the past 10 years and has been treated with both Paxil and Effexor in the past. The patient did have a time where he stopped taking these for one week and had a significant setback from his depression symptoms and is back taking the SSRI medications again.    Interventions Strategy:  Cognitive/behavioral psychotherapeutic interventions  Participation Level:   Active  Participation Quality:  Appropriate      Behavioral Observation:  Well Groomed, Alert, and Appropriate.   Current Psychosocial Factors: The patient reports that the situation between he and his wife has gotten better, but his continued severe pain and physical disabilities continues to cause stress and feelings of inadequacy for patient.  Content of Session:   Reviewed current symptoms and continue to work on building better  coping skills and strategies around issues of his symptoms of depression.  Current Status:   The patient reports continued severe pain and physical limitations.  Severe depressive responses continue as well.  The patient reports that he uses as little narcotic meds as he can still function with due to severe pain.  Patient Progress:   Stable  Goals Addressed Today:    Goals addressed today have to do with increasing his coping skills and strategies around issues of recurrent depression and better dealing with when he is currently going through.  Impression/Diagnosis:   At this point, the patient does have some significant residual pain problems from his ruptured disc. He is having difficulty maintaining his work and is struggling financially and dealing with not being able to work like he had been. At this point, there does appear to be significant symptoms of depression.   Diagnosis:    Axis I:  Depressive disorder  Anxiety  Chronic pain syndrome

## 2014-05-26 ENCOUNTER — Telehealth: Payer: Self-pay | Admitting: *Deleted

## 2014-05-26 NOTE — Telephone Encounter (Signed)
Can we enroll him in the CPAP assistance program? We could at least offer that to him?  David: do you mind asking him?

## 2014-05-26 NOTE — Telephone Encounter (Signed)
He does not qualify for the assistance program since he has health insurance.

## 2014-05-26 NOTE — Telephone Encounter (Signed)
[facility] contacted our office regarding Chad Foster.  He had been referred to them for CPAP set up.  He stated he was currently unemployed and did not have the money to pay the our of pocket expense of  CPAP.  He stated he would follow up with them once he got a job.

## 2014-06-14 ENCOUNTER — Other Ambulatory Visit (HOSPITAL_COMMUNITY): Payer: Self-pay | Admitting: Neurological Surgery

## 2014-06-14 DIAGNOSIS — M5440 Lumbago with sciatica, unspecified side: Secondary | ICD-10-CM

## 2014-06-17 ENCOUNTER — Ambulatory Visit (HOSPITAL_COMMUNITY)
Admission: RE | Admit: 2014-06-17 | Discharge: 2014-06-17 | Disposition: A | Discharge status: Home / Self Care | Payer: BLUE CROSS/BLUE SHIELD | Source: Ambulatory Visit | Attending: Neurological Surgery | Admitting: Neurological Surgery

## 2014-06-17 DIAGNOSIS — M5440 Lumbago with sciatica, unspecified side: Secondary | ICD-10-CM

## 2014-08-05 ENCOUNTER — Ambulatory Visit (INDEPENDENT_AMBULATORY_CARE_PROVIDER_SITE_OTHER): Payer: BLUE CROSS/BLUE SHIELD | Admitting: Psychology

## 2014-08-05 ENCOUNTER — Other Ambulatory Visit: Payer: Self-pay | Admitting: Neurology

## 2014-08-05 ENCOUNTER — Encounter: Payer: Self-pay | Admitting: Neurology

## 2014-08-05 ENCOUNTER — Encounter (HOSPITAL_COMMUNITY): Payer: Self-pay | Admitting: Psychology

## 2014-08-05 DIAGNOSIS — F419 Anxiety disorder, unspecified: Secondary | ICD-10-CM | POA: Diagnosis not present

## 2014-08-05 DIAGNOSIS — G894 Chronic pain syndrome: Secondary | ICD-10-CM

## 2014-08-05 DIAGNOSIS — F329 Major depressive disorder, single episode, unspecified: Secondary | ICD-10-CM | POA: Diagnosis not present

## 2014-08-05 DIAGNOSIS — F32A Depression, unspecified: Secondary | ICD-10-CM

## 2014-08-05 NOTE — Progress Notes (Signed)
PROGRESS NOTE  Patient:  Chad Foster   DOB: 10/28/1970  MR Number: [id number]  Location: [facility] [address] [place] [address] Dept: [phone]  Start: 10 AM End: 11 AM  Provider/Observer:     Edgardo Roys PSYD  Chief Complaint:      Chief Complaint  Patient presents with  . Agitation  . Anxiety  . Depression  . Stress    Reason For Service:     The patient was referred by Dr. Redmond Pulling because of issues related to depression after being out of work due to severe back pain. The patient reports that he had disc rupture and subsequent surgery in L5/S1. The patient recuperated from this for the most part and try to go back to work. He began developing more and more pain and saw Dr. Ellene Route again. He has been having increasing pain difficulties and has missed a lot of work and been limited as far as his physical activity. He has also received spinal injections as well. The patient reports that there is no other surgery possibility. The patient has had issues with depression to some degree of the past 10 years and has been treated with both Paxil and Effexor in the past. The patient did have a time where he stopped taking these for one week and had a significant setback from his depression symptoms and is back taking the SSRI medications again.    Interventions Strategy:  Cognitive/behavioral psychotherapeutic interventions  Participation Level:   Active  Participation Quality:  Appropriate      Behavioral Observation:  Well Groomed, Alert, and Appropriate.   Current Psychosocial Factors: The patient reports that stress continues with patient and family.  Times he feels like his family including wife don't want to be around him.  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  Content of Session:   Reviewed current  symptoms and continue to work on building better coping skills and strategies around issues of his symptoms of depression.  Current Status:   The patient reports continued severe pain and physical limitations.  Severe depressive responses continue as well.  The patient reports that he uses as little narcotic meds as he can still function with due to severe pain.  The patient has tried his hardest, but still not coping or being able to work.  The patient has had a recent sleep study that diagnosis significant sleep apnea. I had been concerned about that. However, the patient at this point has been told insurance company will not pay for a CPAP machine. We talked about strategies to deal to obtain 1 by looking for use CPAP machine Alroy Dust talked with the sleep medicine Center. I think it is very important that the patient actively treat the sleep apnea his that there are likely playing a significant role in his depression and agitation.  Patient Progress:   Stable  Goals Addressed Today:    Goals addressed today have to do with increasing his coping skills and strategies around issues of recurrent depression and better dealing with when he is currently going through.  Impression/Diagnosis:   At this point, the patient does have some significant residual pain problems from his ruptured disc. He is having difficulty maintaining his work and is struggling financially and dealing with not being able to work like he had been. At this point, there does  appear to be significant symptoms of depression.   Diagnosis:    Axis I:  Depressive disorder  Anxiety  Chronic pain syndrome   08/05/2014

## 2014-08-05 NOTE — Progress Notes (Signed)
Patient emailed me today that he cannot afford the co-pays for [facility] for his CPAP. He talked to [facility] in [place] and they would do it for a more affordable price. Please send records and CPAP order to [facility] in [place].

## 2014-08-06 NOTE — Progress Notes (Signed)
Refferal sent to [facility] for processing.

## 2014-09-29 ENCOUNTER — Encounter: Payer: Self-pay | Admitting: Neurology

## 2014-10-13 ENCOUNTER — Encounter (HOSPITAL_COMMUNITY): Payer: Self-pay | Admitting: Psychology

## 2014-10-13 ENCOUNTER — Ambulatory Visit (INDEPENDENT_AMBULATORY_CARE_PROVIDER_SITE_OTHER): Payer: BLUE CROSS/BLUE SHIELD | Admitting: Psychology

## 2014-10-13 DIAGNOSIS — G894 Chronic pain syndrome: Secondary | ICD-10-CM

## 2014-10-13 DIAGNOSIS — F32A Depression, unspecified: Secondary | ICD-10-CM

## 2014-10-13 DIAGNOSIS — F329 Major depressive disorder, single episode, unspecified: Secondary | ICD-10-CM

## 2014-10-13 DIAGNOSIS — F419 Anxiety disorder, unspecified: Secondary | ICD-10-CM

## 2014-10-13 NOTE — Progress Notes (Signed)
PROGRESS NOTE  Patient:  Chad Foster   DOB: [birth year]  MR Number: [id number]  Location: [facility] [address] [place] [address] Dept: [phone]  Start: 10 AM End: 11 AM  Provider/Observer:     Edgardo Roys PSYD  Chief Complaint:      Chief Complaint  Patient presents with  . Anxiety  . Depression  . Stress  . Other    severe chronic pain    Reason For Service:     The patient was referred by Dr. Redmond Pulling because of issues related to depression after being out of work due to severe back pain. The patient reports that he had disc rupture and subsequent surgery in L5/S1. The patient recuperated from this for the most part and try to go back to work. He began developing more and more pain and saw Dr. Ellene Route again. He has been having increasing pain difficulties and has missed a lot of work and been limited as far as his physical activity. He has also received spinal injections as well. The patient reports that there is no other surgery possibility. The patient has had issues with depression to some degree of the past 10 years and has been treated with both Paxil and Effexor in the past. The patient did have a time where he stopped taking these for one week and had a significant setback from his depression symptoms and is back taking the SSRI medications again.    Interventions Strategy:  Cognitive/behavioral psychotherapeutic interventions  Participation Level:   Active  Participation Quality:  Appropriate      Behavioral Observation:  Well Groomed, Alert, and Appropriate.   Current Psychosocial Factors: The patient reports that he continues to have a lot of stress related issues of financial difficulties, difficulties with the fact that he is unable to maintain gainful employment, and significant chronic pain. The patient reports that there are many activities that he is  unable to do that he used to do and this continues to be a struggle for him being unable to engage in these activities. The patient reports he constantly feels like he is letting his wife and his family down and while there have been some conflicts between he and his wife his wife is actually doing in response is not suggestive of her discontent as much as his low self-esteem as a result of his limitations.  Content of Session:   Reviewed current symptoms and continue to work on building better coping skills and strategies around issues of his symptoms of depression.  Current Status:   The patient reports that he continues to have a great deal of stress and difficulty around chronic pain, depression, anxiety. The patient reports that is been very difficult for him as the pain medications make him very groggy and drowsy and he does not like to take them but he has times where significant pain necessitates this. The patient reports that he is trying to find any way that he can to cope with regard to the severe pain. When he does try to do any type of activity that after a short period of time he develops progressive worsening pain and has to stop and then the next day is in severe pain without doing anything..  Patient Progress:   Stable  Goals Addressed Today:    Goals addressed today have to do with increasing his coping skills and strategies around issues of recurrent depression  and better dealing with when he is currently going through.  Impression/Diagnosis:   At this point, the patient does have some significant residual pain problems from his ruptured disc. He is having difficulty maintaining his work and is struggling financially and dealing with not being able to work like he had been. At this point, there does appear to be significant symptoms of depression.   Diagnosis:    Axis I:  Depressive disorder  Anxiety  Chronic pain syndrome   10/13/2014

## 2014-11-10 ENCOUNTER — Ambulatory Visit (HOSPITAL_COMMUNITY): Payer: Self-pay | Admitting: Psychology

## 2014-12-10 ENCOUNTER — Ambulatory Visit (INDEPENDENT_AMBULATORY_CARE_PROVIDER_SITE_OTHER): Payer: BLUE CROSS/BLUE SHIELD | Admitting: Psychology

## 2014-12-10 DIAGNOSIS — F329 Major depressive disorder, single episode, unspecified: Secondary | ICD-10-CM

## 2014-12-10 DIAGNOSIS — G894 Chronic pain syndrome: Secondary | ICD-10-CM | POA: Diagnosis not present

## 2014-12-10 DIAGNOSIS — F32A Depression, unspecified: Secondary | ICD-10-CM

## 2014-12-10 DIAGNOSIS — F419 Anxiety disorder, unspecified: Secondary | ICD-10-CM

## 2014-12-10 NOTE — Progress Notes (Signed)
PROGRESS NOTE  Patient:  Chad Foster   DOB: [birth year]  MR Number: [id number]  Location: [facility] [address] [place] [address] Dept: [phone]  Start: 10 AM End: 11 AM  Provider/Observer:     Edgardo Roys PSYD  Chief Complaint:      Chief Complaint  Patient presents with  . Depression  . Stress  . Anxiety    Reason For Service:     The patient was referred by Dr. Redmond Pulling because of issues related to depression after being out of work due to severe back pain. The patient reports that he had disc rupture and subsequent surgery in L5/S1. The patient recuperated from this for the most part and try to go back to work. He began developing more and more pain and saw Dr. Ellene Route again. He has been having increasing pain difficulties and has missed a lot of work and been limited as far as his physical activity. He has also received spinal injections as well. The patient reports that there is no other surgery possibility. The patient has had issues with depression to some degree of the past 10 years and has been treated with both Paxil and Effexor in the past. The patient did have a time where he stopped taking these for one week and had a significant setback from his depression symptoms and is back taking the SSRI medications again.    Interventions Strategy:  Cognitive/behavioral psychotherapeutic interventions  Participation Level:   Active  Participation Quality:  Appropriate      Behavioral Observation:  Well Groomed, Alert, and Appropriate.   Current Psychosocial Factors: The patient reports that he has had his court appeared for his disability. He reports that while they're waiting to going to see the judge that one of the clerks came out and told him that his case regarding been reviewed and he was approved. His lawyer was not sure about what actually occurred and went back  to confirm with the judge and found out that the case had been reviewed already and he was approved. While the patient hasn't received any disability disbursement she had this has been a great relief to he and his wife. The patient reports that he continues to be severely limited by his physical pain etc. but the fact that he is has some way have contributing financially to the family has helped some self-esteem issues.  Content of Session:   Reviewed current symptoms and continue to work on building better coping skills and strategies around issues of his symptoms of depression.  Current Status:   The patient reports that he continues to deal with significant pain related to nerve root impingement. The patient reports that his mood is been helped by the fact that he has been awarded his disability determination but that he still has difficulty coping with the fact that he is not able to do the things that he has done throughout his life.  Patient Progress:   Stable  Date Reviewed:     7/80/2016  Goals Addressed Today:    Goals addressed today have to do with increasing his coping skills and strategies around issues of recurrent depression and better dealing with when he is currently going through.  Impression/Diagnosis:   At this point, the patient does have some significant residual pain problems from his ruptured disc. He is having difficulty maintaining his work and is struggling financially and dealing with not  being able to work like he had been. At this point, there does appear to be significant symptoms of depression.   Diagnosis:    Axis I:  Depressive disorder  Anxiety  Chronic pain syndrome

## 2015-03-30 ENCOUNTER — Ambulatory Visit (HOSPITAL_COMMUNITY): Payer: Self-pay | Admitting: Psychology

## 2015-05-18 ENCOUNTER — Ambulatory Visit (INDEPENDENT_AMBULATORY_CARE_PROVIDER_SITE_OTHER): Payer: BLUE CROSS/BLUE SHIELD | Admitting: Psychology

## 2015-05-18 DIAGNOSIS — F32A Depression, unspecified: Secondary | ICD-10-CM

## 2015-05-18 DIAGNOSIS — F419 Anxiety disorder, unspecified: Secondary | ICD-10-CM | POA: Diagnosis not present

## 2015-05-18 DIAGNOSIS — F329 Major depressive disorder, single episode, unspecified: Secondary | ICD-10-CM | POA: Diagnosis not present

## 2015-05-20 ENCOUNTER — Encounter (HOSPITAL_COMMUNITY): Payer: Self-pay | Admitting: Psychology

## 2015-05-20 NOTE — Progress Notes (Signed)
    PROGRESS NOTE  Patient:  Chad Foster   DOB: July 20, 1970  MR Number: [id number]  Location: [facility] [address] [place] [address] Dept: [phone]  Start: 10 AM End: 11 AM  Provider/Observer:     Edgardo Roys PSYD  Chief Complaint:      No chief complaint on file.   Reason For Service:     The patient was referred by Dr. Redmond Pulling because of issues related to depression after being out of work due to severe back pain. The patient reports that he had disc rupture and subsequent surgery in L5/S1. The patient recuperated from this for the most part and try to go back to work. He began developing more and more pain and saw Dr. Ellene Route again. He has been having increasing pain difficulties and has missed a lot of work and been limited as far as his physical activity. He has also received spinal injections as well. The patient reports that there is no other surgery possibility. The patient has had issues with depression to some degree of the past 10 years and has been treated with both Paxil and Effexor in the past. The patient did have a time where he stopped taking these for one week and had a significant setback from his depression symptoms and is back taking the SSRI medications again.    Interventions Strategy:  Cognitive/behavioral psychotherapeutic interventions  Participation Level:   Active  Participation Quality:  Appropriate      Behavioral Observation:  Well Groomed, Alert, and Appropriate.   Current Psychosocial Factors: The patient reports that he has been doing better from a stress standpoint recently for the most part. However, he reports there continue to be some conflicts between he and his wife as he has been trying to do as much as he can but his severe back pain and difficulties continue to limit him greatly. The patient reports that he knows that she understands this  and most of this is coming from her frustration about the way things are versus her true anger towards him.  Content of Session:   Reviewed current symptoms and continue to work on building better coping skills and strategies around issues of his symptoms of depression.  Current Status:   The patient reports that he continues to deal with significant pain related to nerve root impingement. The patient reports that having some financial stability has been helpful to him for the most part but he continues to struggle mildly.  Patient Progress:   Stable  Date Reviewed:     05/18/2015  Goals Addressed Today:    Goals addressed today have to do with increasing his coping skills and strategies around issues of recurrent depression and better dealing with when he is currently going through.  Impression/Diagnosis:   At this point, the patient does have some significant residual pain problems from his ruptured disc. He is having difficulty maintaining his work and is struggling financially and dealing with not being able to work like he had been. At this point, there does appear to be significant symptoms of depression.   Diagnosis:    Axis I:  Depressive disorder  Anxiety

## 2015-07-14 IMAGING — CR DG LUMBAR SPINE 1V
1 series · 1 of 1 positions shown · non-contrast
Comparison: MRI 11/19/2012.

CLINICAL DATA: Posterior lumbar single level fusion.

LUMBAR SPINE - 1 VIEW

[view not recorded]
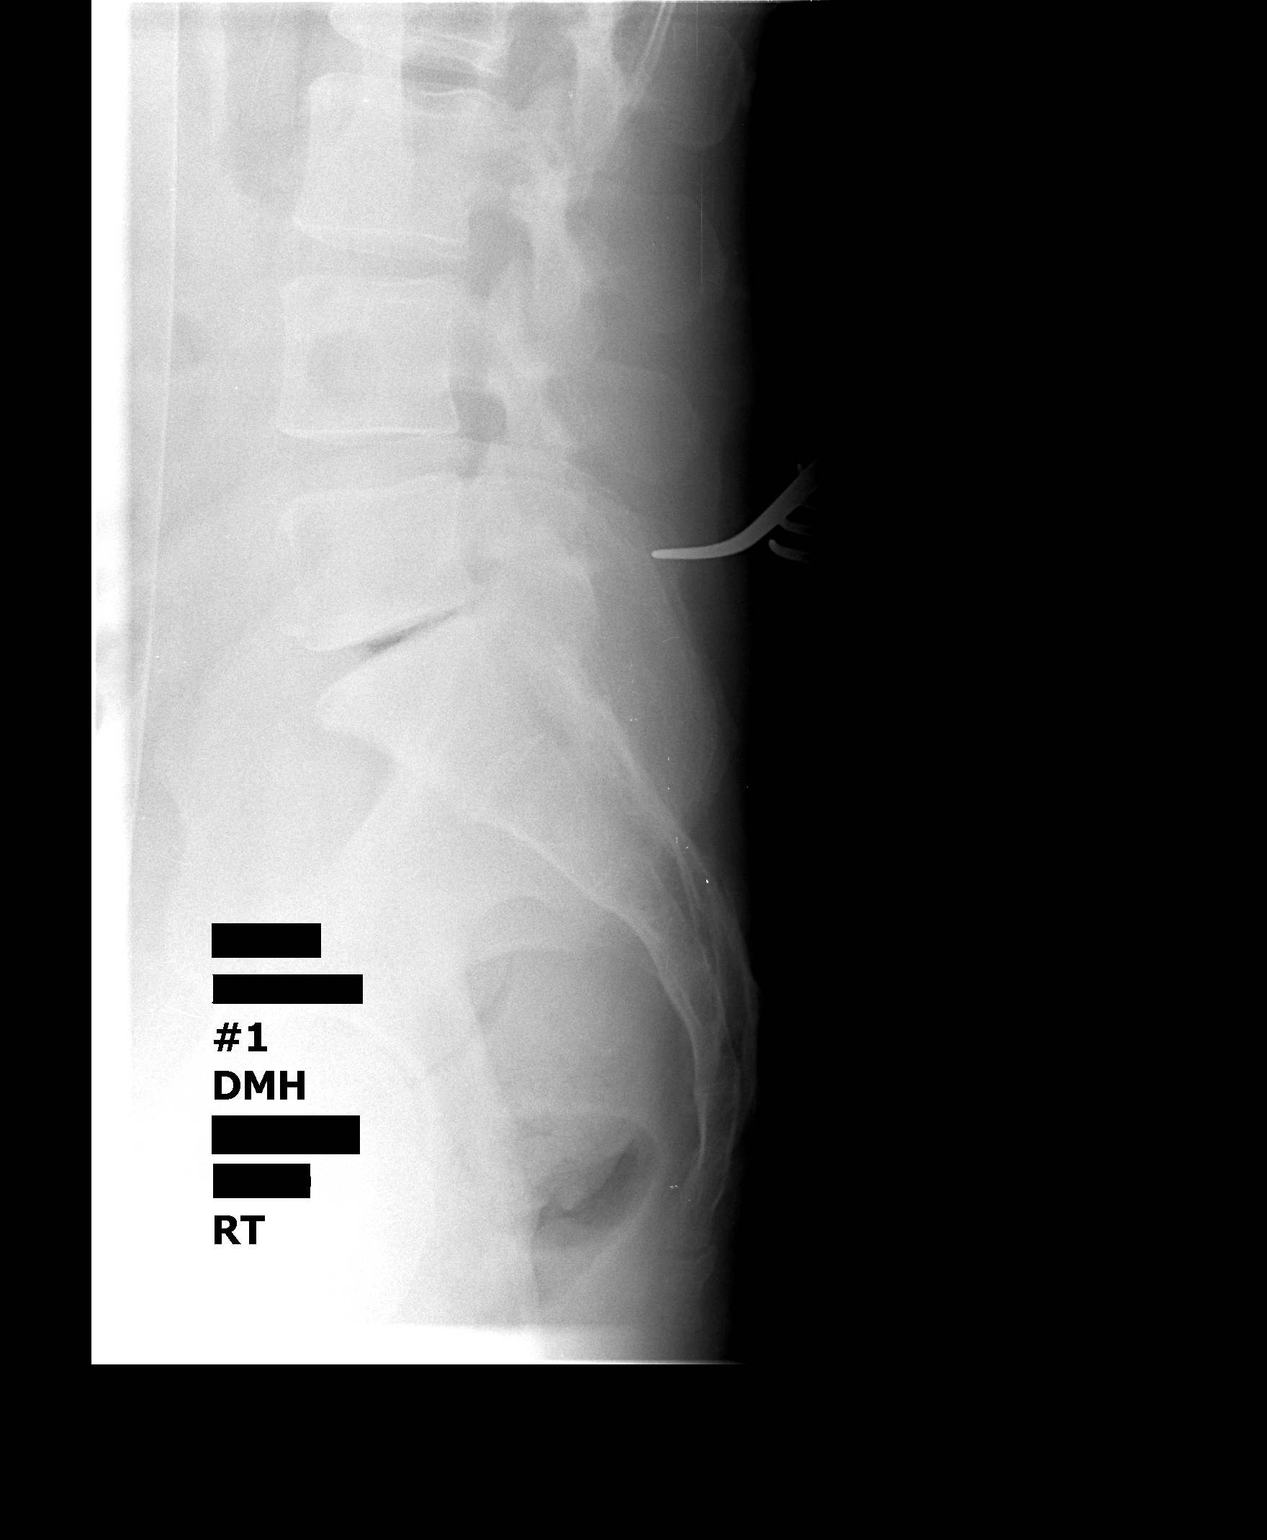

[1 of 1 positions shown; findings below may reference images not displayed]

FINDINGS: Single intraoperative lateral radiograph is submitted
showing a probe dorsal to the L5 spinous process.

L5-S1 vacuum disc.  Numbering is correlated with prior MRI.
IMPRESSION: Intraoperative localization as above.

## 2015-07-14 IMAGING — RF DG C-ARM 61-120 MIN
1 series · 2 of 2 positions shown · non-contrast
Comparison: 12/22/2012.

CLINICAL DATA: Posterior lumbar interbody fusion.

DG C-ARM 1-60 MIN

[Series 1: run · 2 of 2 slices shown]
[im 1/2]
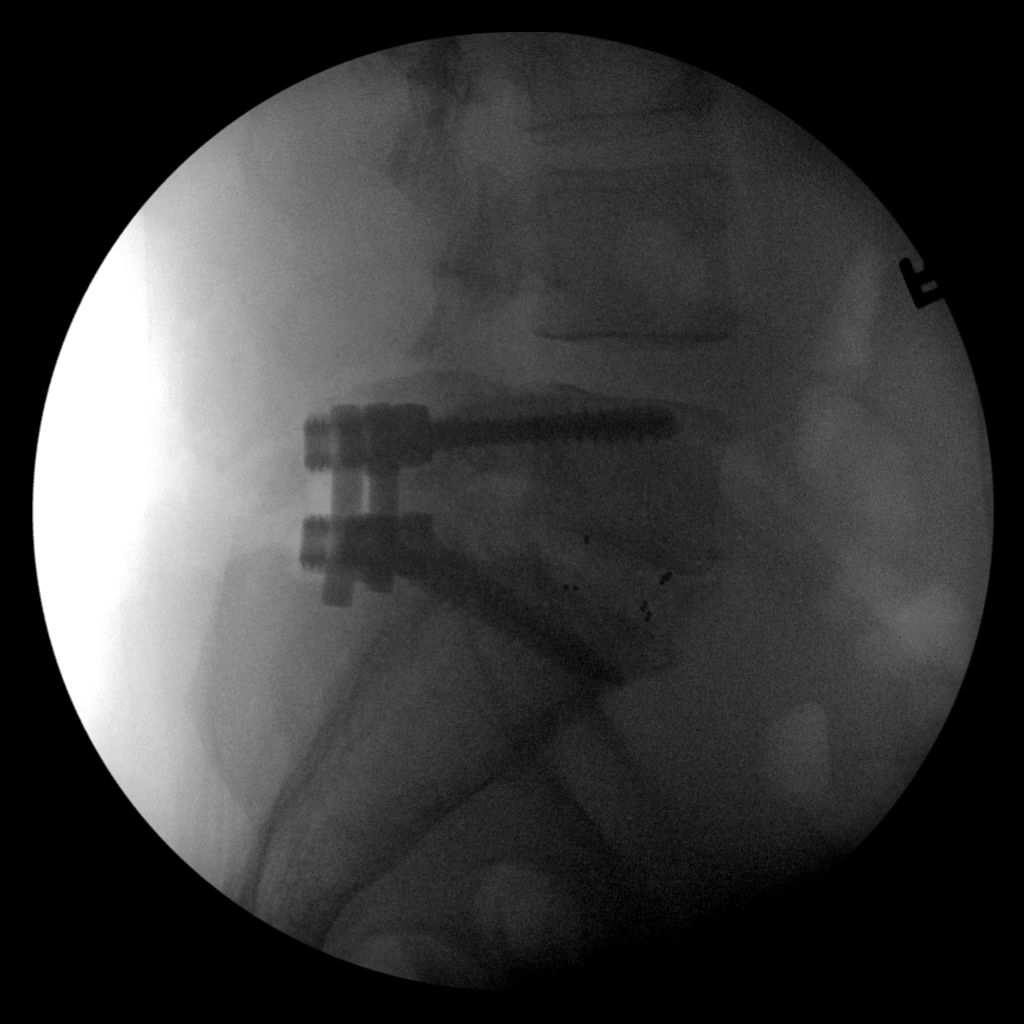
[im 2/2]
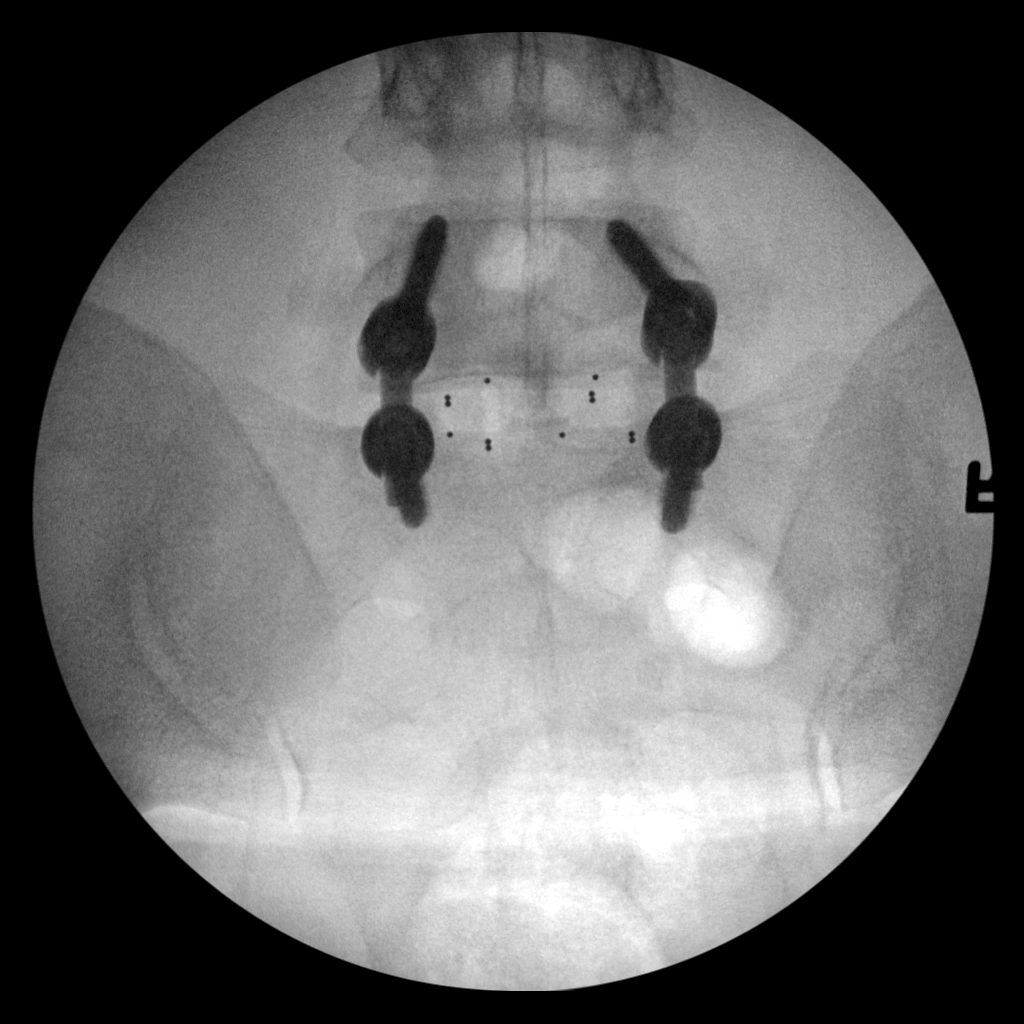

[2 of 2 positions shown; findings below may reference images not displayed]

FINDINGS: New L5-S1 posterior lumbar interbody fusion hardware with
rod and pedicle screw fixation.  L5-S1 discectomy with interbody
bone graft.  AP and lateral fluoroscopic spot views submitted for
interpretation.
IMPRESSION: L5-S1 PLIF.

## 2015-08-25 MED FILL — CONDYLOX 0.5% GEL: 0.5 | 15 days supply | Qty: 4 | Fill #0

## 2015-08-25 MED FILL — DEXILANT DR 60 MG CAPSULE: 60 | 90 days supply | Qty: 90 | Fill #0

## 2015-08-25 MED FILL — OLMESARTAN MEDOXOMIL 40 MG: 40 | 90 days supply | Qty: 90 | Fill #0

## 2015-08-25 MED FILL — VENLAFAXINE HCL ER 150 MG C: 150 | 90 days supply | Qty: 90 | Fill #0

## 2015-08-25 MED FILL — TRIAMCINOLONE 0.5% CREAM: 0.5 | 30 days supply | Qty: 45 | Fill #0

## 2015-08-25 MED FILL — PRAMIPEXOLE 1 MG TABLET: 1 | 90 days supply | Qty: 270 | Fill #0

## 2015-09-01 MED FILL — OXYCODONE-APAP 10-325 TAB: 10-325 | 30 days supply | Qty: 180 | Fill #0

## 2015-09-13 MED FILL — ALPRAZolam 1 MG TABS: 1 | 30 days supply | Qty: 90 | Fill #0

## 2015-09-23 MED FILL — VIAGRA 100 MG TABLET: 100 | 30 days supply | Qty: 6 | Fill #0

## 2015-09-30 MED FILL — OXYCODONE-APAP 10-325 TAB: 10-325 | 30 days supply | Qty: 180 | Fill #0

## 2015-10-19 MED FILL — VIAGRA 100 MG TABLET: 100 | 30 days supply | Qty: 6 | Fill #1

## 2015-10-19 MED FILL — ALPRAZolam 1 MG TABS: 1 | 30 days supply | Qty: 90 | Fill #1

## 2015-10-25 ENCOUNTER — Ambulatory Visit (INDEPENDENT_AMBULATORY_CARE_PROVIDER_SITE_OTHER): Payer: 59 | Admitting: Psychology

## 2015-10-25 DIAGNOSIS — F332 Major depressive disorder, recurrent severe without psychotic features: Secondary | ICD-10-CM | POA: Diagnosis not present

## 2015-10-25 DIAGNOSIS — F419 Anxiety disorder, unspecified: Secondary | ICD-10-CM | POA: Diagnosis not present

## 2015-10-28 ENCOUNTER — Encounter (HOSPITAL_COMMUNITY): Payer: Self-pay | Admitting: Psychology

## 2015-10-28 NOTE — Progress Notes (Signed)
    PROGRESS NOTE  Patient:  Chad Foster   DOB: March 14, 1971  MR Number: [id number]  Location: [facility] [address] [place] [address] Dept: [phone]  Start: 1 PM End: 2 PM  Provider/Observer:     Edgardo Roys PSYD  Chief Complaint:      Chief Complaint  Patient presents with  . Depression  . Stress    Reason For Service:     The patient was referred by Dr. Redmond Pulling because of issues related to depression after being out of work due to severe back pain. The patient reports that he had disc rupture and subsequent surgery in L5/S1. The patient recuperated from this for the most part and try to go back to work. He began developing more and more pain and saw Dr. Ellene Route again. He has been having increasing pain difficulties and has missed a lot of work and been limited as far as his physical activity. He has also received spinal injections as well. The patient reports that there is no other surgery possibility. The patient has had issues with depression to some degree of the past 10 years and has been treated with both Paxil and Effexor in the past. The patient did have a time where he stopped taking these for one week and had a significant setback from his depression symptoms and is back taking the SSRI medications again.    Interventions Strategy:  Cognitive/behavioral psychotherapeutic interventions  Participation Level:   Active  Participation Quality:  Appropriate      Behavioral Observation:  Well Groomed, Alert, and Appropriate.   Current Psychosocial Factors: The patient reports that he has been doing better Overall but this is not apparently being responded to reviewed by his family as inadequate improvement. He reports that there are ongoing conflict between he and his wife about discipline and parenting of their son. The son is becoming increasingly demanding and the patient  was put expectations on him that conflict with the way his wife is going to respond.  Content of Session:   Reviewed current symptoms and continue to work on building better coping skills and strategies around issues of his symptoms of depression.  Current Status:   The patient reports that he continues to deal with significant pain related to nerve root impingement. The patient reports that having some financial stability has been helpful to him for the most part but he continues to struggle mildly.  Patient Progress:   Stable  Date Reviewed:     10/26/2015  Goals Addressed Today:    Goals addressed today have to do with increasing his coping skills and strategies around issues of recurrent depression and better dealing with when he is currently going through.  Impression/Diagnosis:   At this point, the patient does have some significant residual pain problems from his ruptured disc. He is having difficulty maintaining his work and is struggling financially and dealing with not being able to work like he had been. At this point, there does appear to be significant symptoms of depression.   Diagnosis:    Axis I:  Severe episode of recurrent major depressive disorder, without psychotic features ([place])  Anxiety

## 2015-11-02 DIAGNOSIS — Z1389 Encounter for screening for other disorder: Secondary | ICD-10-CM | POA: Diagnosis not present

## 2015-11-02 DIAGNOSIS — Z6830 Body mass index (BMI) 30.0-30.9, adult: Secondary | ICD-10-CM | POA: Diagnosis not present

## 2015-11-02 DIAGNOSIS — G894 Chronic pain syndrome: Secondary | ICD-10-CM | POA: Diagnosis not present

## 2015-11-02 DIAGNOSIS — F419 Anxiety disorder, unspecified: Secondary | ICD-10-CM | POA: Diagnosis not present

## 2015-11-07 MED FILL — OXYCODONE-APAP 10-325 TAB: 10-325 | 30 days supply | Qty: 180 | Fill #0

## 2015-11-15 ENCOUNTER — Ambulatory Visit (HOSPITAL_COMMUNITY): Payer: Medicare Other | Admitting: Psychology

## 2015-11-22 MED FILL — VIAGRA 100 MG TABLET: 100 | 30 days supply | Qty: 6 | Fill #2

## 2015-12-20 MED FILL — VIAGRA 100 MG TABLET: 100 | 30 days supply | Qty: 6 | Fill #3

## 2015-12-20 MED FILL — ALPRAZolam 1 MG TABS: 1 | 30 days supply | Qty: 90 | Fill #2

## 2016-01-17 MED FILL — DEXILANT DR 60 MG CAPSULE: 60 | 90 days supply | Qty: 90 | Fill #1

## 2016-01-17 MED FILL — VENLAFAXINE HCL ER 150 MG C: 150 | 90 days supply | Qty: 90 | Fill #1

## 2016-01-17 MED FILL — VIAGRA 100 MG TABLET: 100 | 30 days supply | Qty: 6 | Fill #4

## 2016-01-17 MED FILL — ALPRAZolam 1 MG TABS: 1 | 30 days supply | Qty: 90 | Fill #0

## 2016-01-17 MED FILL — OLMESARTAN MEDOXOMIL 40 MG: 40 | 90 days supply | Qty: 90 | Fill #1

## 2016-01-17 MED FILL — PRAMIPEXOLE 1 MG TABLET: 1 | 90 days supply | Qty: 270 | Fill #1

## 2016-01-31 MED FILL — OXYCODONE-APAP 10-325 TAB: 10-325 | 30 days supply | Qty: 180 | Fill #0

## 2016-02-27 MED FILL — VIAGRA 100 MG TABLET: 100 | 30 days supply | Qty: 6 | Fill #5

## 2016-02-27 MED FILL — ALPRAZolam 1 MG TABS: 1 | 30 days supply | Qty: 90 | Fill #1

## 2016-02-29 ENCOUNTER — Ambulatory Visit (INDEPENDENT_AMBULATORY_CARE_PROVIDER_SITE_OTHER): Payer: 59 | Admitting: Psychology

## 2016-02-29 DIAGNOSIS — F332 Major depressive disorder, recurrent severe without psychotic features: Secondary | ICD-10-CM | POA: Diagnosis not present

## 2016-02-29 DIAGNOSIS — F419 Anxiety disorder, unspecified: Secondary | ICD-10-CM | POA: Diagnosis not present

## 2016-02-29 DIAGNOSIS — G894 Chronic pain syndrome: Secondary | ICD-10-CM

## 2016-03-01 ENCOUNTER — Encounter (HOSPITAL_COMMUNITY): Payer: Self-pay | Admitting: Psychology

## 2016-03-01 NOTE — Progress Notes (Signed)
    PROGRESS NOTE  Patient:  Chad Foster   DOB: 01-10-1971  MR Number: [id number]  Location: [facility] [address] [place] [address] Dept: [phone]  Start: 1 PM End: 2 PM  Provider/Observer:     Edgardo Roys PSYD  Chief Complaint:      Chief Complaint  Patient presents with  . Anxiety  . Depression  . Stress    Reason For Service:     The patient was referred by Dr. Redmond Pulling because of issues related to depression after being out of work due to severe back pain. The patient reports that he had disc rupture and subsequent surgery in L5/S1. The patient recuperated from this for the most part and try to go back to work. He began developing more and more pain and saw Dr. Ellene Route again. He has been having increasing pain difficulties and has missed a lot of work and been limited as far as his physical activity. He has also received spinal injections as well. The patient reports that there is no other surgery possibility. The patient has had issues with depression to some degree of the past 10 years and has been treated with both Paxil and Effexor in the past. The patient did have a time where he stopped taking these for one week and had a significant setback from his depression symptoms and is back taking the SSRI medications again.    Interventions Strategy:  Cognitive/behavioral psychotherapeutic interventions  Participation Level:   Active  Participation Quality:  Appropriate      Behavioral Observation:  Well Groomed, Alert, and Appropriate.   Current Psychosocial Factors: The patient returns today reporting more issues with conflicts with wife and so a very competitive situation that is developing between he and his wifen.  The patient describes   Content of Session:   Reviewed current symptoms and continue to work on building better coping skills and strategies around issues  of his symptoms of depression.  Current Status:   The patient reports that he continues to deal with significant pain related to nerve root impingement. The patient reports that having some financial stability has been helpful to him for the most part but he continues to struggle  Significantly.  The patient is describing more stressors at home leading from financial stressors and conflicts between he and his wife.   Patient Progress:   Stable  Date Reviewed:     02/29/2016  Goals Addressed Today:    Goals addressed today have to do with increasing his coping skills and strategies around issues of recurrent depression and better dealing with when he is currently going through.  Impression/Diagnosis:   At this point, the patient does have some significant residual pain problems from his ruptured disc. He is having difficulty maintaining his work and is struggling financially and dealing with not being able to work like he had been. At this point, there does appear to be significant symptoms of depression.   Diagnosis:    Axis I:  1. Severe episode of recurrent major depressive disorder, without psychotic features ([place])    2. Anxiety    3. Chronic pain syndrome

## 2016-03-15 DIAGNOSIS — F419 Anxiety disorder, unspecified: Secondary | ICD-10-CM | POA: Diagnosis not present

## 2016-03-15 DIAGNOSIS — G894 Chronic pain syndrome: Secondary | ICD-10-CM | POA: Diagnosis not present

## 2016-03-15 DIAGNOSIS — K219 Gastro-esophageal reflux disease without esophagitis: Secondary | ICD-10-CM | POA: Diagnosis not present

## 2016-03-15 DIAGNOSIS — Z6831 Body mass index (BMI) 31.0-31.9, adult: Secondary | ICD-10-CM | POA: Diagnosis not present

## 2016-03-15 DIAGNOSIS — I10 Essential (primary) hypertension: Secondary | ICD-10-CM | POA: Diagnosis not present

## 2016-03-16 MED FILL — OXYCODONE-APAP 10-325 TAB: 10-325 | 30 days supply | Qty: 180 | Fill #0

## 2016-03-27 MED FILL — SILDENAFIL 20 MG TABLET: 20 | 30 days supply | Qty: 30 | Fill #0

## 2016-03-27 MED FILL — ALPRAZolam 1 MG TABS: 1 | 30 days supply | Qty: 90 | Fill #0

## 2016-03-27 MED FILL — SILDENAFIL 20 MG TABLET: 20 | 30 days supply | Qty: 30 | Fill #1 | Status: TO

## 2016-04-19 MED FILL — OXYCODONE-APAP 10-325 TAB: 10-325 | 30 days supply | Qty: 180 | Fill #0

## 2016-04-24 MED FILL — DEXILANT DR 60 MG CAPSULE: 60 | 90 days supply | Qty: 90 | Fill #0 | Status: TO

## 2016-04-24 MED FILL — ALPRAZolam 1 MG TABS: 1 | 30 days supply | Qty: 90 | Fill #1 | Status: TO

## 2016-04-24 MED FILL — VENLAFAXINE HCL ER 150 MG C: 150 | 90 days supply | Qty: 90 | Fill #0 | Status: TO

## 2016-05-01 DIAGNOSIS — R5383 Other fatigue: Secondary | ICD-10-CM | POA: Diagnosis not present

## 2016-05-01 DIAGNOSIS — Z79891 Long term (current) use of opiate analgesic: Secondary | ICD-10-CM | POA: Diagnosis not present

## 2016-05-01 DIAGNOSIS — E785 Hyperlipidemia, unspecified: Secondary | ICD-10-CM | POA: Diagnosis not present

## 2016-05-01 DIAGNOSIS — I10 Essential (primary) hypertension: Secondary | ICD-10-CM | POA: Diagnosis not present

## 2016-05-01 DIAGNOSIS — E748 Other specified disorders of carbohydrate metabolism: Secondary | ICD-10-CM | POA: Diagnosis not present

## 2016-05-01 DIAGNOSIS — Z125 Encounter for screening for malignant neoplasm of prostate: Secondary | ICD-10-CM | POA: Diagnosis not present

## 2016-05-21 MED FILL — OXYCODONE-APAP 10-325: 10-325 | 30 days supply | Qty: 180 | Fill #0

## 2016-06-05 ENCOUNTER — Ambulatory Visit (INDEPENDENT_AMBULATORY_CARE_PROVIDER_SITE_OTHER): Payer: Medicare HMO | Admitting: Psychology

## 2016-06-05 DIAGNOSIS — F419 Anxiety disorder, unspecified: Secondary | ICD-10-CM

## 2016-06-05 DIAGNOSIS — F332 Major depressive disorder, recurrent severe without psychotic features: Secondary | ICD-10-CM

## 2016-06-05 DIAGNOSIS — G894 Chronic pain syndrome: Secondary | ICD-10-CM | POA: Diagnosis not present

## 2016-06-07 NOTE — Progress Notes (Signed)
PROGRESS NOTE  Patient:  Chad Foster   DOB: [birth year]  MR Number: [id number]  Location: [facility] [address] [place] [address] Dept: [phone]  Start: 1 PM End: 2 PM  Provider/Observer:     Edgardo Roys PSYD  Chief Complaint:      Chief Complaint  Patient presents with  . Agitation  . Depression    Reason For Service:     The patient was referred by Dr. Redmond Pulling because of issues related to depression after being out of work due to severe back pain. The patient reports that he had disc rupture and subsequent surgery in L5/S1. The patient recuperated from this for the most part and try to go back to work. He began developing more and more pain and saw Dr. Ellene Route again. He has been having increasing pain difficulties and has missed a lot of work and been limited as far as his physical activity. He has also received spinal injections as well. The patient reports that there is no other surgery possibility. The patient has had issues with depression to some degree of the past 10 years and has been treated with both Paxil and Effexor in the past. The patient did have a time where he stopped taking these for one week and had a significant setback from his depression symptoms and is back taking the SSRI medications again.    Interventions Strategy:  Cognitive/behavioral psychotherapeutic interventions  Participation Level:   Active  Participation Quality:  Appropriate      Behavioral Observation:  Well Groomed, Alert, and Appropriate.   Current Psychosocial Factors: The patient returns today reporting That he and his wife is separated. He is moved into the renal house that he owns to try to work through what is going on. He reports that he just does not trust his wife to be supportive of him and that she in his view is always being secretive and manipulating financial issues and  constantly taken responsibility for her issues. The patient reports that this is constantly overwhelmed him and that he is feeling better about moving out and thinks that the ultimate outcome will be their divorce.  Content of Session:   Reviewed current symptoms and continue to work on building better coping skills and strategies around issues of his symptoms of depression.  Current Status:   The patient reports that he continues to deal with significant pain related to nerve root impingement. The patient reports that having some financial stability has been helpful to him for the most part but he continues to struggle  Significantly.  The patient is describing more stressors at home leading from financial stressors and conflicts between he and his wife.   Patient Progress:   Stable  Date Reviewed:     06/05/2016  Goals Addressed Today:    Goals addressed today have to do with increasing his coping skills and strategies around issues of recurrent depression and better dealing with when he is currently going through.  Impression/Diagnosis:   At this point, the patient does have some significant residual pain problems from his ruptured disc. He is having difficulty maintaining his work and is struggling financially and dealing with not being able to work like he had been. At this point, there does appear to be significant symptoms of depression.   Diagnosis:    Axis I:  1. Severe episode of recurrent major depressive disorder, without psychotic features ([place])  2. Anxiety    3. Chronic pain syndrome

## 2016-06-25 DIAGNOSIS — Z6832 Body mass index (BMI) 32.0-32.9, adult: Secondary | ICD-10-CM | POA: Diagnosis not present

## 2016-06-25 DIAGNOSIS — Z1389 Encounter for screening for other disorder: Secondary | ICD-10-CM | POA: Diagnosis not present

## 2016-06-25 DIAGNOSIS — G894 Chronic pain syndrome: Secondary | ICD-10-CM | POA: Diagnosis not present

## 2016-06-25 DIAGNOSIS — K219 Gastro-esophageal reflux disease without esophagitis: Secondary | ICD-10-CM | POA: Diagnosis not present

## 2016-06-25 DIAGNOSIS — D492 Neoplasm of unspecified behavior of bone, soft tissue, and skin: Secondary | ICD-10-CM | POA: Diagnosis not present

## 2016-09-27 DIAGNOSIS — K219 Gastro-esophageal reflux disease without esophagitis: Secondary | ICD-10-CM | POA: Diagnosis not present

## 2016-09-27 DIAGNOSIS — G894 Chronic pain syndrome: Secondary | ICD-10-CM | POA: Diagnosis not present

## 2016-09-27 DIAGNOSIS — Z6830 Body mass index (BMI) 30.0-30.9, adult: Secondary | ICD-10-CM | POA: Diagnosis not present

## 2016-09-27 DIAGNOSIS — F419 Anxiety disorder, unspecified: Secondary | ICD-10-CM | POA: Diagnosis not present

## 2016-09-27 DIAGNOSIS — I10 Essential (primary) hypertension: Secondary | ICD-10-CM | POA: Diagnosis not present

## 2016-09-27 DIAGNOSIS — M7711 Lateral epicondylitis, right elbow: Secondary | ICD-10-CM | POA: Diagnosis not present

## 2016-09-27 DIAGNOSIS — M5136 Other intervertebral disc degeneration, lumbar region: Secondary | ICD-10-CM | POA: Diagnosis not present

## 2016-09-27 DIAGNOSIS — Z1389 Encounter for screening for other disorder: Secondary | ICD-10-CM | POA: Diagnosis not present

## 2017-01-11 DIAGNOSIS — B079 Viral wart, unspecified: Secondary | ICD-10-CM | POA: Diagnosis not present

## 2017-01-11 DIAGNOSIS — G894 Chronic pain syndrome: Secondary | ICD-10-CM | POA: Diagnosis not present

## 2017-01-11 DIAGNOSIS — Z1389 Encounter for screening for other disorder: Secondary | ICD-10-CM | POA: Diagnosis not present

## 2017-01-11 DIAGNOSIS — L209 Atopic dermatitis, unspecified: Secondary | ICD-10-CM | POA: Diagnosis not present

## 2017-01-11 DIAGNOSIS — Z6830 Body mass index (BMI) 30.0-30.9, adult: Secondary | ICD-10-CM | POA: Diagnosis not present

## 2017-01-11 DIAGNOSIS — F419 Anxiety disorder, unspecified: Secondary | ICD-10-CM | POA: Diagnosis not present

## 2017-03-25 DIAGNOSIS — E6609 Other obesity due to excess calories: Secondary | ICD-10-CM | POA: Diagnosis not present

## 2017-03-25 DIAGNOSIS — I10 Essential (primary) hypertension: Secondary | ICD-10-CM | POA: Diagnosis not present

## 2017-03-25 DIAGNOSIS — K2970 Gastritis, unspecified, without bleeding: Secondary | ICD-10-CM | POA: Diagnosis not present

## 2017-03-25 DIAGNOSIS — Z1389 Encounter for screening for other disorder: Secondary | ICD-10-CM | POA: Diagnosis not present

## 2017-03-25 DIAGNOSIS — Z6830 Body mass index (BMI) 30.0-30.9, adult: Secondary | ICD-10-CM | POA: Diagnosis not present

## 2017-03-28 ENCOUNTER — Emergency Department (HOSPITAL_COMMUNITY)
Admission: EM | Admit: 2017-03-28 | Discharge: 2017-03-28 | Disposition: A | Discharge status: Home / Self Care | Payer: Medicare HMO | Attending: Emergency Medicine | Admitting: Emergency Medicine

## 2017-03-28 ENCOUNTER — Encounter (HOSPITAL_COMMUNITY): Payer: Self-pay | Admitting: Emergency Medicine

## 2017-03-28 DIAGNOSIS — Z87891 Personal history of nicotine dependence: Secondary | ICD-10-CM | POA: Insufficient documentation

## 2017-03-28 DIAGNOSIS — Z79899 Other long term (current) drug therapy: Secondary | ICD-10-CM | POA: Diagnosis not present

## 2017-03-28 DIAGNOSIS — I10 Essential (primary) hypertension: Secondary | ICD-10-CM | POA: Diagnosis not present

## 2017-03-28 DIAGNOSIS — R42 Dizziness and giddiness: Secondary | ICD-10-CM | POA: Insufficient documentation

## 2017-03-28 LAB — CBC WITH DIFFERENTIAL/PLATELET
BASOS ABS: 0.1 10*3/uL (ref 0.0–0.1)
Basophils Relative: 1 %
Eosinophils Absolute: 0.1 10*3/uL (ref 0.0–0.7)
Eosinophils Relative: 1 %
HEMATOCRIT: 46.8 % (ref 39.0–52.0)
Hemoglobin: 16.8 g/dL (ref 13.0–17.0)
LYMPHS ABS: 2.1 10*3/uL (ref 0.7–4.0)
LYMPHS PCT: 22 %
MCH: 32.9 pg (ref 26.0–34.0)
MCHC: 35.9 g/dL (ref 30.0–36.0)
MCV: 91.8 fL (ref 78.0–100.0)
MONO ABS: 1 10*3/uL (ref 0.1–1.0)
MONOS PCT: 10 %
NEUTROS ABS: 6.5 10*3/uL (ref 1.7–7.7)
Neutrophils Relative %: 66 %
PLATELETS: 212 10*3/uL (ref 150–400)
RBC: 5.1 MIL/uL (ref 4.22–5.81)
RDW: 13.1 % (ref 11.5–15.5)
WBC: 9.8 10*3/uL (ref 4.0–10.5)

## 2017-03-28 LAB — COMPREHENSIVE METABOLIC PANEL
ALT: 21 U/L (ref 17–63)
ANION GAP: 9 (ref 5–15)
AST: 20 U/L (ref 15–41)
Albumin: 3.9 g/dL (ref 3.5–5.0)
Alkaline Phosphatase: 85 U/L (ref 38–126)
BUN: 11 mg/dL (ref 6–20)
CHLORIDE: 100 mmol/L — AB (ref 101–111)
CO2: 29 mmol/L (ref 22–32)
Calcium: 9.2 mg/dL (ref 8.9–10.3)
Creatinine, Ser: 0.8 mg/dL (ref 0.61–1.24)
Glucose, Bld: 107 mg/dL — ABNORMAL HIGH (ref 65–99)
POTASSIUM: 3.5 mmol/L (ref 3.5–5.1)
Sodium: 138 mmol/L (ref 135–145)
TOTAL PROTEIN: 6.9 g/dL (ref 6.5–8.1)
Total Bilirubin: 1.1 mg/dL (ref 0.3–1.2)

## 2017-03-28 NOTE — ED Provider Notes (Signed)
[facility] EMERGENCY DEPARTMENT Provider Note   CSN: [id number] Arrival date & time: 03/28/17  2021     History   Chief Complaint Chief Complaint  Patient presents with  . Dizziness    HPI Chad Foster is a 46 y.o. male.  Patient states that his blood pressures been elevated and he has been feeling dizzy lately.  He increased his Benicar today and took 40 mg once a day 20 mg.  Patient feels fine now   The history is provided by the patient.  Dizziness  Quality:  Lightheadedness and imbalance Severity:  Moderate Onset quality:  Sudden Timing:  Rare Progression:  Resolved Chronicity:  New Context: not when bending over   Associated symptoms: no chest pain, no diarrhea and no headaches     Past Medical History:  Diagnosis Date  . Anxiety    takes Xanax daily  . Back pain   . Depression    takes Effexor daily  . Enlarged prostate    but no meds required at present  . GERD (gastroesophageal reflux disease)    takes Dexilant daily  . Headache(784.0)   . Hypertension    takes benicar daily  . Insomnia   . Insomnia    takes Xanax nightly  . Restless leg   . Weakness    bilateral legs d/t back problems    Patient Active Problem List   Diagnosis Date Noted  . Sciatica of left side 11/16/2012  . Essential hypertension, benign 10/10/2012  . Restless legs syndrome 10/10/2012  . Esophageal reflux 10/10/2012  . Chronic back pain 10/10/2012  . Lumbago 04/30/2012    Past Surgical History:  Procedure Laterality Date  . BACK SURGERY     2010, Dr. Ellene Route, L5-S1  . CHOLECYSTECTOMY    . FINGER FRACTURE SURGERY Right    reattatched  . MASS EXCISION  06/23/2012   Procedure: EXCISION MASS;  Surgeon: Jamesetta So, MD;  Location: [facility];  Service: General;  Laterality: N/A;  Excision Chest Wall Mass  . NASAL SINUS SURGERY         Home Medications    Prior to Admission medications   Medication Sig Start Date End Date Taking? Authorizing Provider    ALPRAZolam Duanne Moron) 1 MG tablet Take 1 mg by mouth 3 (three) times daily as needed for sleep.    [provider]  BENICAR 40 MG tablet TAKE 1 TABLET AT BEDTIME (NEEDS OFFICE VISIT) 12/17/13   Kathyrn Drown, MD  celecoxib (CELEBREX) 200 MG capsule Take 1 capsule (200 mg total) by mouth 2 (two) times daily as needed. For pain 11/14/12   Mikey Kirschner, MD  dexlansoprazole (DEXILANT) 60 MG capsule Take 1 capsule (60 mg total) by mouth daily. 11/14/12   Mikey Kirschner, MD  diclofenac sodium (VOLTAREN) 1 % GEL Apply 4 g topically 4 (four) times daily. 10/10/12   Mikey Kirschner, MD  lidocaine (LIDODERM) 5 % Place 1 patch onto the skin daily. Remove & Discard patch within 12 hours or as directed by MD    [provider]  methocarbamol (ROBAXIN) 500 MG tablet Take 1 tablet (500 mg total) by mouth every 6 (six) hours as needed. 12/23/12   Kristeen Miss, MD  oxyCODONE-acetaminophen (PERCOCET) 10-325 MG per tablet Take 1-1.5 tablets by mouth 4 (four) times daily.     [provider]  pramipexole (MIRAPEX) 1 MG tablet Take 1 tablet (1 mg total) by mouth 3 (three) times daily. 11/14/12  Mikey Kirschner, MD  sildenafil (VIAGRA) 100 MG tablet Take 1 tablet (100 mg total) by mouth as needed for erectile dysfunction. 11/14/12   Mikey Kirschner, MD  venlafaxine XR (EFFEXOR-XR) 150 MG 24 hr capsule TAKE 1 CAPSULE DAILY 06/11/13   Kathyrn Drown, MD    Family History Family History  Problem Relation Age of Onset  . Cancer - Lung Father   . Cancer Brother     Social History Social History  Substance Use Topics  . Smoking status: Former Smoker    Packs/day: 1.50    Years: 15.00    Types: Cigars, Cigarettes  . Smokeless tobacco: Current User     Comment: quit 16 yrs ago,smokes E-Cigs  . Alcohol use Yes     Comment: weekend beers     Allergies   Patient has no known allergies.   Review of Systems Review of Systems  Constitutional: Negative for appetite change and  fatigue.  HENT: Negative for congestion, ear discharge and sinus pressure.   Eyes: Negative for discharge.  Respiratory: Negative for cough.   Cardiovascular: Negative for chest pain.  Gastrointestinal: Negative for abdominal pain and diarrhea.  Genitourinary: Negative for frequency and hematuria.  Musculoskeletal: Negative for back pain.  Skin: Negative for rash.  Neurological: Positive for dizziness. Negative for seizures and headaches.  Psychiatric/Behavioral: Negative for hallucinations.     Physical Exam Updated Vital Signs BP (!) 147/93   Pulse 83   Temp 98.4 F (36.9 C) (Oral)   Resp (!) 24   Ht 6' (1.829 m)   Wt 96.2 kg (212 lb)   SpO2 95%   BMI 28.75 kg/m   Physical Exam  Constitutional: He is oriented to person, place, and time. He appears well-developed.  HENT:  Head: Normocephalic.  Eyes: Conjunctivae and EOM are normal. No scleral icterus.  Neck: Neck supple. No thyromegaly present.  Cardiovascular: Normal rate and regular rhythm.  Exam reveals no gallop and no friction rub.   No murmur heard. Pulmonary/Chest: No stridor. He has no wheezes. He has no rales. He exhibits no tenderness.  Abdominal: He exhibits no distension. There is no tenderness. There is no rebound.  Musculoskeletal: Normal range of motion. He exhibits no edema.  Lymphadenopathy:    He has no cervical adenopathy.  Neurological: He is oriented to person, place, and time. He exhibits normal muscle tone. Coordination normal.  Skin: No rash noted. No erythema.  Psychiatric: He has a normal mood and affect. His behavior is normal.     ED Treatments / Results  Labs (all labs ordered are listed, but only abnormal results are displayed) Labs Reviewed  COMPREHENSIVE METABOLIC PANEL - Abnormal; Notable for the following:       Result Value   Chloride 100 (*)    Glucose, Bld 107 (*)    All other components within normal limits  CBC WITH DIFFERENTIAL/PLATELET    EKG  EKG  Interpretation None       Radiology No results found.  Procedures Procedures (including critical care time)  Medications Ordered in ED Medications - No data to display   Initial Impression / Assessment and Plan / ED Course  I have reviewed the triage vital signs and the nursing notes.  Pertinent labs & imaging results that were available during my care of the patient were reviewed by me and considered in my medical decision making (see chart for details).     Patient with poorly controlled blood pressure.  I am  increasing his Benicar to 40 mg a day and he will follow-up with his PCP  Final Clinical Impressions(s) / ED Diagnoses   Final diagnoses:  Dizziness    New Prescriptions New Prescriptions   No medications on file     Milton Ferguson, MD 03/28/17 2214

## 2017-03-28 NOTE — ED Triage Notes (Signed)
Pt c/o intermittent dizziness and heart fluttering over last couple of days. Pt states his blood pressure has been high the last couple of day and he took 2 extra blood pressure pills today.

## 2017-03-28 NOTE — Discharge Instructions (Signed)
Increase blood pressure medicine to 40 mg a day and follow-up with your doctor as planned

## 2017-04-03 DIAGNOSIS — I10 Essential (primary) hypertension: Secondary | ICD-10-CM | POA: Diagnosis not present

## 2017-04-03 DIAGNOSIS — E6609 Other obesity due to excess calories: Secondary | ICD-10-CM | POA: Diagnosis not present

## 2017-04-03 DIAGNOSIS — Z6830 Body mass index (BMI) 30.0-30.9, adult: Secondary | ICD-10-CM | POA: Diagnosis not present

## 2017-04-03 DIAGNOSIS — Z1389 Encounter for screening for other disorder: Secondary | ICD-10-CM | POA: Diagnosis not present

## 2017-07-02 DIAGNOSIS — I10 Essential (primary) hypertension: Secondary | ICD-10-CM | POA: Diagnosis not present

## 2017-07-02 DIAGNOSIS — Z6830 Body mass index (BMI) 30.0-30.9, adult: Secondary | ICD-10-CM | POA: Diagnosis not present

## 2017-07-02 DIAGNOSIS — E6609 Other obesity due to excess calories: Secondary | ICD-10-CM | POA: Diagnosis not present

## 2017-07-22 DIAGNOSIS — M2550 Pain in unspecified joint: Secondary | ICD-10-CM | POA: Diagnosis not present

## 2017-07-22 DIAGNOSIS — E6609 Other obesity due to excess calories: Secondary | ICD-10-CM | POA: Diagnosis not present

## 2017-07-22 DIAGNOSIS — I10 Essential (primary) hypertension: Secondary | ICD-10-CM | POA: Diagnosis not present

## 2017-07-22 DIAGNOSIS — M5136 Other intervertebral disc degeneration, lumbar region: Secondary | ICD-10-CM | POA: Diagnosis not present

## 2017-07-22 DIAGNOSIS — Z6831 Body mass index (BMI) 31.0-31.9, adult: Secondary | ICD-10-CM | POA: Diagnosis not present

## 2017-07-22 DIAGNOSIS — F419 Anxiety disorder, unspecified: Secondary | ICD-10-CM | POA: Diagnosis not present

## 2017-07-22 DIAGNOSIS — G894 Chronic pain syndrome: Secondary | ICD-10-CM | POA: Diagnosis not present

## 2017-10-18 DIAGNOSIS — Z6830 Body mass index (BMI) 30.0-30.9, adult: Secondary | ICD-10-CM | POA: Diagnosis not present

## 2017-10-18 DIAGNOSIS — E6609 Other obesity due to excess calories: Secondary | ICD-10-CM | POA: Diagnosis not present

## 2017-10-18 DIAGNOSIS — Z1389 Encounter for screening for other disorder: Secondary | ICD-10-CM | POA: Diagnosis not present

## 2017-10-18 DIAGNOSIS — G894 Chronic pain syndrome: Secondary | ICD-10-CM | POA: Diagnosis not present

## 2017-10-18 DIAGNOSIS — F419 Anxiety disorder, unspecified: Secondary | ICD-10-CM | POA: Diagnosis not present

## 2017-10-18 DIAGNOSIS — M5136 Other intervertebral disc degeneration, lumbar region: Secondary | ICD-10-CM | POA: Diagnosis not present

## 2017-10-18 DIAGNOSIS — B355 Tinea imbricata: Secondary | ICD-10-CM | POA: Diagnosis not present

## 2017-10-18 DIAGNOSIS — I10 Essential (primary) hypertension: Secondary | ICD-10-CM | POA: Diagnosis not present

## 2018-01-21 DIAGNOSIS — M5136 Other intervertebral disc degeneration, lumbar region: Secondary | ICD-10-CM | POA: Diagnosis not present

## 2018-01-21 DIAGNOSIS — Z0001 Encounter for general adult medical examination with abnormal findings: Secondary | ICD-10-CM | POA: Diagnosis not present

## 2018-01-21 DIAGNOSIS — R5383 Other fatigue: Secondary | ICD-10-CM | POA: Diagnosis not present

## 2018-01-21 DIAGNOSIS — E782 Mixed hyperlipidemia: Secondary | ICD-10-CM | POA: Diagnosis not present

## 2018-01-21 DIAGNOSIS — E6609 Other obesity due to excess calories: Secondary | ICD-10-CM | POA: Diagnosis not present

## 2018-01-21 DIAGNOSIS — Z6831 Body mass index (BMI) 31.0-31.9, adult: Secondary | ICD-10-CM | POA: Diagnosis not present

## 2018-01-21 DIAGNOSIS — F419 Anxiety disorder, unspecified: Secondary | ICD-10-CM | POA: Diagnosis not present

## 2018-04-22 DIAGNOSIS — M5136 Other intervertebral disc degeneration, lumbar region: Secondary | ICD-10-CM | POA: Diagnosis not present

## 2018-04-22 DIAGNOSIS — F329 Major depressive disorder, single episode, unspecified: Secondary | ICD-10-CM | POA: Diagnosis not present

## 2018-04-22 DIAGNOSIS — I10 Essential (primary) hypertension: Secondary | ICD-10-CM | POA: Diagnosis not present

## 2018-04-22 DIAGNOSIS — G894 Chronic pain syndrome: Secondary | ICD-10-CM | POA: Diagnosis not present

## 2018-04-22 DIAGNOSIS — E6609 Other obesity due to excess calories: Secondary | ICD-10-CM | POA: Diagnosis not present

## 2018-04-22 DIAGNOSIS — Z1389 Encounter for screening for other disorder: Secondary | ICD-10-CM | POA: Diagnosis not present

## 2018-04-22 DIAGNOSIS — F419 Anxiety disorder, unspecified: Secondary | ICD-10-CM | POA: Diagnosis not present

## 2018-04-22 DIAGNOSIS — Z6833 Body mass index (BMI) 33.0-33.9, adult: Secondary | ICD-10-CM | POA: Diagnosis not present

## 2018-07-09 DIAGNOSIS — L821 Other seborrheic keratosis: Secondary | ICD-10-CM | POA: Diagnosis not present

## 2018-07-09 DIAGNOSIS — D229 Melanocytic nevi, unspecified: Secondary | ICD-10-CM | POA: Diagnosis not present

## 2018-10-23 DIAGNOSIS — E6609 Other obesity due to excess calories: Secondary | ICD-10-CM | POA: Diagnosis not present

## 2018-10-23 DIAGNOSIS — Z1389 Encounter for screening for other disorder: Secondary | ICD-10-CM | POA: Diagnosis not present

## 2018-10-23 DIAGNOSIS — Z6831 Body mass index (BMI) 31.0-31.9, adult: Secondary | ICD-10-CM | POA: Diagnosis not present

## 2018-10-23 DIAGNOSIS — F419 Anxiety disorder, unspecified: Secondary | ICD-10-CM | POA: Diagnosis not present

## 2018-11-11 DIAGNOSIS — Z0001 Encounter for general adult medical examination with abnormal findings: Secondary | ICD-10-CM | POA: Diagnosis not present

## 2018-11-11 DIAGNOSIS — F419 Anxiety disorder, unspecified: Secondary | ICD-10-CM | POA: Diagnosis not present

## 2018-11-11 DIAGNOSIS — F329 Major depressive disorder, single episode, unspecified: Secondary | ICD-10-CM | POA: Diagnosis not present

## 2018-11-11 DIAGNOSIS — I10 Essential (primary) hypertension: Secondary | ICD-10-CM | POA: Diagnosis not present

## 2018-11-11 DIAGNOSIS — E6609 Other obesity due to excess calories: Secondary | ICD-10-CM | POA: Diagnosis not present

## 2018-11-11 DIAGNOSIS — Z1389 Encounter for screening for other disorder: Secondary | ICD-10-CM | POA: Diagnosis not present

## 2018-11-11 DIAGNOSIS — Z6830 Body mass index (BMI) 30.0-30.9, adult: Secondary | ICD-10-CM | POA: Diagnosis not present

## 2018-11-11 DIAGNOSIS — R945 Abnormal results of liver function studies: Secondary | ICD-10-CM | POA: Diagnosis not present

## 2018-11-11 DIAGNOSIS — G2581 Restless legs syndrome: Secondary | ICD-10-CM | POA: Diagnosis not present

## 2019-02-25 DIAGNOSIS — F419 Anxiety disorder, unspecified: Secondary | ICD-10-CM | POA: Diagnosis not present

## 2019-08-25 DIAGNOSIS — R369 Urethral discharge, unspecified: Secondary | ICD-10-CM | POA: Diagnosis not present

## 2019-11-11 DIAGNOSIS — K219 Gastro-esophageal reflux disease without esophagitis: Secondary | ICD-10-CM | POA: Diagnosis not present

## 2019-11-11 DIAGNOSIS — M5136 Other intervertebral disc degeneration, lumbar region: Secondary | ICD-10-CM | POA: Diagnosis not present

## 2019-11-11 DIAGNOSIS — Z1389 Encounter for screening for other disorder: Secondary | ICD-10-CM | POA: Diagnosis not present

## 2019-11-11 DIAGNOSIS — Z1211 Encounter for screening for malignant neoplasm of colon: Secondary | ICD-10-CM | POA: Diagnosis not present

## 2019-11-11 DIAGNOSIS — E6609 Other obesity due to excess calories: Secondary | ICD-10-CM | POA: Diagnosis not present

## 2019-11-11 DIAGNOSIS — F329 Major depressive disorder, single episode, unspecified: Secondary | ICD-10-CM | POA: Diagnosis not present

## 2019-11-11 DIAGNOSIS — I10 Essential (primary) hypertension: Secondary | ICD-10-CM | POA: Diagnosis not present

## 2019-11-11 DIAGNOSIS — Z6830 Body mass index (BMI) 30.0-30.9, adult: Secondary | ICD-10-CM | POA: Diagnosis not present

## 2019-11-11 DIAGNOSIS — Z0000 Encounter for general adult medical examination without abnormal findings: Secondary | ICD-10-CM | POA: Diagnosis not present

## 2019-11-11 DIAGNOSIS — G2581 Restless legs syndrome: Secondary | ICD-10-CM | POA: Diagnosis not present

## 2020-02-18 DIAGNOSIS — F419 Anxiety disorder, unspecified: Secondary | ICD-10-CM | POA: Diagnosis not present

## 2020-05-16 DIAGNOSIS — I10 Essential (primary) hypertension: Secondary | ICD-10-CM | POA: Diagnosis not present

## 2020-05-16 DIAGNOSIS — M5136 Other intervertebral disc degeneration, lumbar region: Secondary | ICD-10-CM | POA: Diagnosis not present

## 2020-05-16 DIAGNOSIS — M21372 Foot drop, left foot: Secondary | ICD-10-CM | POA: Diagnosis not present

## 2020-05-16 DIAGNOSIS — F419 Anxiety disorder, unspecified: Secondary | ICD-10-CM | POA: Diagnosis not present

## 2020-06-15 DIAGNOSIS — Z6832 Body mass index (BMI) 32.0-32.9, adult: Secondary | ICD-10-CM | POA: Diagnosis not present

## 2020-06-15 DIAGNOSIS — I10 Essential (primary) hypertension: Secondary | ICD-10-CM | POA: Diagnosis not present

## 2020-06-15 DIAGNOSIS — E6609 Other obesity due to excess calories: Secondary | ICD-10-CM | POA: Diagnosis not present

## 2020-07-12 DIAGNOSIS — J019 Acute sinusitis, unspecified: Secondary | ICD-10-CM | POA: Diagnosis not present

## 2020-08-24 DIAGNOSIS — M5430 Sciatica, unspecified side: Secondary | ICD-10-CM | POA: Diagnosis not present

## 2020-11-15 DIAGNOSIS — I10 Essential (primary) hypertension: Secondary | ICD-10-CM | POA: Diagnosis not present

## 2020-11-15 DIAGNOSIS — F419 Anxiety disorder, unspecified: Secondary | ICD-10-CM | POA: Diagnosis not present

## 2021-01-18 DIAGNOSIS — Z6829 Body mass index (BMI) 29.0-29.9, adult: Secondary | ICD-10-CM | POA: Diagnosis not present

## 2021-01-18 DIAGNOSIS — F329 Major depressive disorder, single episode, unspecified: Secondary | ICD-10-CM | POA: Diagnosis not present

## 2021-01-18 DIAGNOSIS — E663 Overweight: Secondary | ICD-10-CM | POA: Diagnosis not present

## 2021-01-18 DIAGNOSIS — I10 Essential (primary) hypertension: Secondary | ICD-10-CM | POA: Diagnosis not present

## 2021-04-18 DIAGNOSIS — F329 Major depressive disorder, single episode, unspecified: Secondary | ICD-10-CM | POA: Diagnosis not present

## 2021-04-18 DIAGNOSIS — F419 Anxiety disorder, unspecified: Secondary | ICD-10-CM | POA: Diagnosis not present

## 2021-08-23 DIAGNOSIS — H9313 Tinnitus, bilateral: Secondary | ICD-10-CM | POA: Diagnosis not present

## 2021-08-23 DIAGNOSIS — E6609 Other obesity due to excess calories: Secondary | ICD-10-CM | POA: Diagnosis not present

## 2021-08-23 DIAGNOSIS — F419 Anxiety disorder, unspecified: Secondary | ICD-10-CM | POA: Diagnosis not present

## 2021-08-23 DIAGNOSIS — Z6830 Body mass index (BMI) 30.0-30.9, adult: Secondary | ICD-10-CM | POA: Diagnosis not present

## 2021-08-23 DIAGNOSIS — Z1331 Encounter for screening for depression: Secondary | ICD-10-CM | POA: Diagnosis not present

## 2021-11-29 DIAGNOSIS — F419 Anxiety disorder, unspecified: Secondary | ICD-10-CM | POA: Diagnosis not present

## 2021-11-29 DIAGNOSIS — L309 Dermatitis, unspecified: Secondary | ICD-10-CM | POA: Diagnosis not present

## 2021-11-29 DIAGNOSIS — I10 Essential (primary) hypertension: Secondary | ICD-10-CM | POA: Diagnosis not present

## 2021-11-29 DIAGNOSIS — L0390 Cellulitis, unspecified: Secondary | ICD-10-CM | POA: Diagnosis not present

## 2021-12-20 DIAGNOSIS — R69 Illness, unspecified: Secondary | ICD-10-CM | POA: Diagnosis not present

## 2022-03-20 DIAGNOSIS — F419 Anxiety disorder, unspecified: Secondary | ICD-10-CM | POA: Diagnosis not present

## 2022-03-20 DIAGNOSIS — I10 Essential (primary) hypertension: Secondary | ICD-10-CM | POA: Diagnosis not present

## 2022-04-20 DIAGNOSIS — K0889 Other specified disorders of teeth and supporting structures: Secondary | ICD-10-CM | POA: Diagnosis not present

## 2022-04-20 DIAGNOSIS — I10 Essential (primary) hypertension: Secondary | ICD-10-CM | POA: Diagnosis not present

## 2022-04-20 DIAGNOSIS — Z1331 Encounter for screening for depression: Secondary | ICD-10-CM | POA: Diagnosis not present

## 2022-04-20 DIAGNOSIS — Z0001 Encounter for general adult medical examination with abnormal findings: Secondary | ICD-10-CM | POA: Diagnosis not present

## 2022-04-20 DIAGNOSIS — Z6827 Body mass index (BMI) 27.0-27.9, adult: Secondary | ICD-10-CM | POA: Diagnosis not present

## 2022-04-20 DIAGNOSIS — D518 Other vitamin B12 deficiency anemias: Secondary | ICD-10-CM | POA: Diagnosis not present

## 2022-04-20 DIAGNOSIS — E559 Vitamin D deficiency, unspecified: Secondary | ICD-10-CM | POA: Diagnosis not present

## 2022-04-20 DIAGNOSIS — Z125 Encounter for screening for malignant neoplasm of prostate: Secondary | ICD-10-CM | POA: Diagnosis not present

## 2022-04-20 DIAGNOSIS — E663 Overweight: Secondary | ICD-10-CM | POA: Diagnosis not present

## 2022-04-20 DIAGNOSIS — E039 Hypothyroidism, unspecified: Secondary | ICD-10-CM | POA: Diagnosis not present

## 2022-06-11 DIAGNOSIS — H524 Presbyopia: Secondary | ICD-10-CM | POA: Diagnosis not present

## 2022-06-15 DIAGNOSIS — H524 Presbyopia: Secondary | ICD-10-CM | POA: Diagnosis not present

## 2022-06-15 DIAGNOSIS — H52223 Regular astigmatism, bilateral: Secondary | ICD-10-CM | POA: Diagnosis not present

## 2022-08-17 DIAGNOSIS — I10 Essential (primary) hypertension: Secondary | ICD-10-CM | POA: Diagnosis not present

## 2022-08-17 DIAGNOSIS — F419 Anxiety disorder, unspecified: Secondary | ICD-10-CM | POA: Diagnosis not present

## 2023-02-21 ENCOUNTER — Encounter (INDEPENDENT_AMBULATORY_CARE_PROVIDER_SITE_OTHER): Payer: Self-pay | Admitting: *Deleted

## 2023-02-21 ENCOUNTER — Ambulatory Visit (INDEPENDENT_AMBULATORY_CARE_PROVIDER_SITE_OTHER): Payer: Medicare HMO | Admitting: Internal Medicine

## 2023-02-21 ENCOUNTER — Encounter: Payer: Self-pay | Admitting: Internal Medicine

## 2023-02-21 VITALS — BP 146/88 | HR 63 | Ht 72.0 in | Wt 200.0 lb

## 2023-02-21 DIAGNOSIS — E782 Mixed hyperlipidemia: Secondary | ICD-10-CM | POA: Diagnosis not present

## 2023-02-21 DIAGNOSIS — E559 Vitamin D deficiency, unspecified: Secondary | ICD-10-CM

## 2023-02-21 DIAGNOSIS — Z79899 Other long term (current) drug therapy: Secondary | ICD-10-CM | POA: Diagnosis not present

## 2023-02-21 DIAGNOSIS — Z1159 Encounter for screening for other viral diseases: Secondary | ICD-10-CM | POA: Diagnosis not present

## 2023-02-21 DIAGNOSIS — M48062 Spinal stenosis, lumbar region with neurogenic claudication: Secondary | ICD-10-CM | POA: Diagnosis not present

## 2023-02-21 DIAGNOSIS — R195 Other fecal abnormalities: Secondary | ICD-10-CM | POA: Insufficient documentation

## 2023-02-21 DIAGNOSIS — Z2821 Immunization not carried out because of patient refusal: Secondary | ICD-10-CM

## 2023-02-21 DIAGNOSIS — G4733 Obstructive sleep apnea (adult) (pediatric): Secondary | ICD-10-CM | POA: Diagnosis not present

## 2023-02-21 DIAGNOSIS — Z125 Encounter for screening for malignant neoplasm of prostate: Secondary | ICD-10-CM | POA: Insufficient documentation

## 2023-02-21 DIAGNOSIS — F411 Generalized anxiety disorder: Secondary | ICD-10-CM | POA: Diagnosis not present

## 2023-02-21 DIAGNOSIS — I10 Essential (primary) hypertension: Secondary | ICD-10-CM | POA: Diagnosis not present

## 2023-02-21 DIAGNOSIS — Z114 Encounter for screening for human immunodeficiency virus [HIV]: Secondary | ICD-10-CM

## 2023-02-21 DIAGNOSIS — R739 Hyperglycemia, unspecified: Secondary | ICD-10-CM

## 2023-02-21 MED ORDER — AMLODIPINE BESYLATE 5 MG PO TABS: 5.0000 mg | ORAL_TABLET | Freq: Every day | ORAL | 1 refills | Status: DC

## 2023-02-21 NOTE — Progress Notes (Addendum)
New Patient Office Visit  Subjective:  Patient ID: Cloise Cambareri, male    DOB: 04-Nov-1970  Age: 52 y.o. MRN: [id number]  CC:  Chief Complaint  Patient presents with   Establish Care   Referral    Referral to gastro for colonoscopy due to abnormal home test     HPI Ardian Nawaz is a 52 y.o. male with past medical history of HTN, OSA, GAD and lumbar spinal stenosis who presents for establishing care.  HTN: His blood pressure was elevated today.  He currently takes olmesartan 40 mg QD and diltiazem to 40 mg twice daily.  He has tried any other antihypertensive medicine yet.  Denies any headache, dizziness, chest pain, dyspnea or palpitations.  He reports elevated BP at home as well.  OSA: He has history of sleep apnea, but does not use CPAP device as he did not get proper rest even with CPAP use.  Last sleep study was in 2015.  He also reports periodic limb movements at nighttime and has been told of restless leg syndrome.  He has tried pramipexole without much relief.  GAD: He takes Xanax 1 mg 4 times daily and Cymbalta 30 mg QD.  Denies anhedonia, SI or HI currently.  He agrees to slowly cut down Xanax use to maximum 3 times daily.  Lumbar foraminal stenosis: Reports lumbar spine surgery x 2.  He has chronic low back pain, but is manageable currently.  He has intermittent numbness of the feet as well.  Denies saddle anesthesia, urinary or stool incontinence.  He recently had positive FIT testing.  He has noticed blood on toilet paper at times, but denies melena or hematochezia.  Denies chronic constipation.  He has noticed bulging in the anal area, which is likely hemorrhoid.  Past Medical History:  Diagnosis Date   Enlarged prostate    but no meds required at present   GERD (gastroesophageal reflux disease)    takes Dexilant daily   Hypertension    takes benicar daily   Insomnia    Weakness    bilateral legs d/t back problems    Past Surgical History:  Procedure  Laterality Date   BACK SURGERY     2010, Dr. Danielle Dess, L5-S1   CHOLECYSTECTOMY     FINGER FRACTURE SURGERY Right    reattatched   MASS EXCISION  06/23/2012   Procedure: EXCISION MASS;  Surgeon: Dalia Heading, MD;  Location: [facility];  Service: General;  Laterality: N/A;  Excision Chest Wall Mass   NASAL SINUS SURGERY      Family History  Problem Relation Age of Onset   Cancer - Lung Father    Cancer Brother     Social History   Socioeconomic History   Marital status: Single    Spouse name: Management consultant   Number of children: 2   Years of education: 12   Highest education level: Not on file  Occupational History    Comment: not employed  Tobacco Use   Smoking status: Former    Current packs/day: 1.50    Average packs/day: 1.5 packs/day for 15.0 years (22.5 ttl pk-yrs)    Types: Cigars, Cigarettes   Smokeless tobacco: Current   Tobacco comments:    quit 16 yrs ago,smokes E-Cigs  Vaping Use   Vaping status: Former  Substance and Sexual Activity   Alcohol use: Yes    Comment: weekend beers   Drug use: Yes    Types: Marijuana   Sexual activity: Yes  Birth control/protection: None  Other Topics Concern   Not on file  Social History Narrative   Patient is right handed and consumes 2-3 sodas daily   Social Determinants of Health   Financial Resource Strain: Not on file  Food Insecurity: Not on file  Transportation Needs: Not on file  Physical Activity: Not on file  Stress: Not on file  Social Connections: Not on file  Intimate Partner Violence: Not on file    ROS Review of Systems  Constitutional:  Negative for chills and fever.  HENT:  Negative for congestion and sore throat.   Eyes:  Negative for pain and discharge.  Respiratory:  Negative for cough and shortness of breath.   Cardiovascular:  Negative for chest pain and palpitations.  Gastrointestinal:  Negative for diarrhea, nausea and vomiting.  Endocrine: Negative for polydipsia and polyuria.  Genitourinary:   Negative for dysuria and hematuria.  Musculoskeletal:  Positive for back pain. Negative for neck pain and neck stiffness.  Skin:  Negative for rash.  Neurological:  Negative for dizziness and weakness.  Psychiatric/Behavioral:  Negative for agitation and behavioral problems. The patient is nervous/anxious.     Objective:   Today's Vitals: BP (!) 146/88 (BP Location: Right Arm)   Pulse 63   Ht 6' (1.829 m)   Wt 200 lb (90.7 kg)   SpO2 98%   BMI 27.12 kg/m   Physical Exam Vitals reviewed.  Constitutional:      General: He is not in acute distress.    Appearance: He is not diaphoretic.  HENT:     Head: Normocephalic and atraumatic.     Nose: Nose normal.     Mouth/Throat:     Mouth: Mucous membranes are moist.  Eyes:     General: No scleral icterus.    Extraocular Movements: Extraocular movements intact.  Cardiovascular:     Rate and Rhythm: Normal rate and regular rhythm.     Heart sounds: Normal heart sounds. No murmur heard. Pulmonary:     Breath sounds: Normal breath sounds. No wheezing or rales.  Musculoskeletal:     Cervical back: Neck supple. No tenderness.     Right lower leg: No edema.     Left lower leg: No edema.  Skin:    General: Skin is warm.     Findings: No rash.  Neurological:     General: No focal deficit present.     Mental Status: He is alert and oriented to person, place, and time.     Cranial Nerves: No cranial nerve deficit.     Sensory: No sensory deficit.     Motor: Weakness (B/l LE - 4/5) present.  Psychiatric:        Mood and Affect: Mood normal.        Behavior: Behavior normal.     Assessment & Plan:   Problem List Items Addressed This Visit       Cardiovascular and Mediastinum   Essential hypertension - Primary    BP Readings from Last 1 Encounters:  02/21/23 (!) 146/88   Uncontrolled with olmesartan 40 mg QD and diltiazem to 240 mg twice daily DC diltiazem and started amlodipine 5 mg QD Counseled for compliance with the  medications Advised DASH diet and moderate exercise/walking, at least 150 mins/week       Relevant Medications   diltiazem (CARDIZEM CD) 240 MG 24 hr capsule   Other Relevant Orders   TSH (Completed)   CMP14+EGFR (Completed)   CBC with Differential/Platelet (  Completed)     Respiratory   OSA (obstructive sleep apnea)    Untreated sleep apnea likely contributing to elevated BP and also periodic limb movement Referral to sleep specialist      Relevant Orders   Ambulatory referral to Pulmonology     Other   Spinal stenosis of lumbar region with neurogenic claudication    S/p lumbar spine surgery X 2 Used to take oral opioids      Relevant Medications   DULoxetine (CYMBALTA) 30 MG capsule   GAD (generalized anxiety disorder)    Well-controlled with Cymbalta 30 mg QD and Xanax 1 mg 4 times daily Advised to cut down Xanax to maximum 3 times daily      Relevant Medications   DULoxetine (CYMBALTA) 30 MG capsule   Other Relevant Orders   TSH (Completed)   Positive FIT (fecal immunochemical test)    Has hemorrhoid related intermittent bleeding Referred to GI for screening colonoscopy      Relevant Orders   Ambulatory referral to Gastroenterology   Prostate cancer screening    Ordered PSA after discussing its limitations for prostate cancer screening, including false positive results leading to additional investigations.      Relevant Orders   PSA (Completed)   Other Visit Diagnoses     Mixed hyperlipidemia       Relevant Medications   diltiazem (CARDIZEM CD) 240 MG 24 hr capsule   Other Relevant Orders   Lipid panel (Completed)   Hyperglycemia       Relevant Orders   Hemoglobin A1c (Completed)   Vitamin D deficiency       Relevant Orders   VITAMIN D 25 Hydroxy (Vit-D Deficiency, Fractures) (Completed)   Need for hepatitis C screening test       Relevant Orders   Hepatitis C Antibody (Completed)   Encounter for screening for HIV       Relevant Orders   HIV  antibody (with reflex) (Completed)   Chronic prescription benzodiazepine use       Relevant Orders   ToxASSURE Select 13 (MW), Urine (Completed)   Refused influenza vaccine          Addendum: Had palpitations after he stopped Diltiazem. Restarted Diltiazem. Stopped Amlodipine.   Outpatient Encounter Medications as of 02/21/2023  Medication Sig   ALPRAZolam (XANAX) 1 MG tablet Take 1 mg by mouth 3 (three) times daily as needed for sleep.   BENICAR 40 MG tablet TAKE 1 TABLET AT BEDTIME (NEEDS OFFICE VISIT)   diltiazem (CARDIZEM CD) 240 MG 24 hr capsule Take 240 mg by mouth 2 (two) times daily.   DULoxetine (CYMBALTA) 30 MG capsule Take 30 mg by mouth daily.   folic acid (FOLVITE) 1 MG tablet Take 1 mg by mouth daily.   sildenafil (VIAGRA) 100 MG tablet Take 1 tablet (100 mg total) by mouth as needed for erectile dysfunction.   [DISCONTINUED] amLODipine (NORVASC) 5 MG tablet Take 1 tablet (5 mg total) by mouth daily. (Patient not taking: Reported on 02/28/2023)   [DISCONTINUED] diltiazem (CARDIZEM CD) 240 MG 24 hr capsule Take 240 mg by mouth 2 (two) times daily.   [DISCONTINUED] celecoxib (CELEBREX) 200 MG capsule Take 1 capsule (200 mg total) by mouth 2 (two) times daily as needed. For pain (Patient not taking: Reported on 02/21/2023)   [DISCONTINUED] dexlansoprazole (DEXILANT) 60 MG capsule Take 1 capsule (60 mg total) by mouth daily. (Patient not taking: Reported on 02/21/2023)   [DISCONTINUED] diclofenac sodium (VOLTAREN) 1 % GEL  Apply 4 g topically 4 (four) times daily. (Patient not taking: Reported on 02/21/2023)   [DISCONTINUED] lidocaine (LIDODERM) 5 % Place 1 patch onto the skin daily. Remove & Discard patch within 12 hours or as directed by MD (Patient not taking: Reported on 02/21/2023)   [DISCONTINUED] methocarbamol (ROBAXIN) 500 MG tablet Take 1 tablet (500 mg total) by mouth every 6 (six) hours as needed. (Patient not taking: Reported on 02/21/2023)   [DISCONTINUED]  oxyCODONE-acetaminophen (PERCOCET) 10-325 MG per tablet Take 1-1.5 tablets by mouth 4 (four) times daily.  (Patient not taking: Reported on 02/21/2023)   [DISCONTINUED] pramipexole (MIRAPEX) 1 MG tablet Take 1 tablet (1 mg total) by mouth 3 (three) times daily. (Patient not taking: Reported on 02/21/2023)   [DISCONTINUED] venlafaxine XR (EFFEXOR-XR) 150 MG 24 hr capsule TAKE 1 CAPSULE DAILY (Patient not taking: Reported on 02/21/2023)   No facility-administered encounter medications on file as of 02/21/2023.    Follow-up: Return in about 2 months (around 04/23/2023) for Annual physical (after 11/17).   Anabel Halon, MD

## 2023-02-21 NOTE — Assessment & Plan Note (Signed)
S/p lumbar spine surgery X 2 Used to take oral opioids

## 2023-02-21 NOTE — Patient Instructions (Addendum)
Please start taking Amlodipine 5 mg once daily instead of Diltiazem.  Please try to decrease Xanax to maximum 3 tablets in a day.  You are being referred to GI for colonoscopy.  Please continue to take other medications as prescribed.  Please continue to follow low salt diet and perform moderate exercise/walking at least 150 mins/week.

## 2023-02-21 NOTE — Assessment & Plan Note (Signed)
Untreated sleep apnea likely contributing to elevated BP and also periodic limb movement Referral to sleep specialist

## 2023-02-21 NOTE — Assessment & Plan Note (Signed)
Well-controlled with Cymbalta 30 mg QD and Xanax 1 mg 4 times daily Advised to cut down Xanax to maximum 3 times daily

## 2023-02-21 NOTE — Assessment & Plan Note (Signed)
Ordered PSA after discussing its limitations for prostate cancer screening, including false positive results leading to additional investigations. 

## 2023-02-21 NOTE — Assessment & Plan Note (Addendum)
BP Readings from Last 1 Encounters:  02/21/23 (!) 146/88   Uncontrolled with olmesartan 40 mg QD and diltiazem to 240 mg twice daily DC diltiazem and started amlodipine 5 mg QD Counseled for compliance with the medications Advised DASH diet and moderate exercise/walking, at least 150 mins/week

## 2023-02-21 NOTE — Assessment & Plan Note (Signed)
Has hemorrhoid related intermittent bleeding Referred to GI for screening colonoscopy

## 2023-02-22 LAB — HIV ANTIBODY (ROUTINE TESTING W REFLEX): HIV Screen 4th Generation wRfx: NONREACTIVE

## 2023-02-22 LAB — HEPATITIS C ANTIBODY: Hep C Virus Ab: NONREACTIVE

## 2023-02-23 LAB — CBC WITH DIFFERENTIAL/PLATELET
Basophils Absolute: 0.1 10*3/uL (ref 0.0–0.2)
Basos: 1 %
EOS (ABSOLUTE): 0.1 10*3/uL (ref 0.0–0.4)
Eos: 1 %
Hematocrit: 53.1 % — ABNORMAL HIGH (ref 37.5–51.0)
Hemoglobin: 18 g/dL — ABNORMAL HIGH (ref 13.0–17.7)
Immature Grans (Abs): 0 10*3/uL (ref 0.0–0.1)
Immature Granulocytes: 0 %
Lymphocytes Absolute: 1.4 10*3/uL (ref 0.7–3.1)
Lymphs: 14 %
MCH: 32.5 pg (ref 26.6–33.0)
MCHC: 33.9 g/dL (ref 31.5–35.7)
MCV: 96 fL (ref 79–97)
Monocytes Absolute: 1 10*3/uL — ABNORMAL HIGH (ref 0.1–0.9)
Monocytes: 9 %
Neutrophils Absolute: 7.9 10*3/uL — ABNORMAL HIGH (ref 1.4–7.0)
Neutrophils: 75 %
Platelets: 222 10*3/uL (ref 150–450)
RBC: 5.53 x10E6/uL (ref 4.14–5.80)
RDW: 12.4 % (ref 11.6–15.4)
WBC: 10.5 10*3/uL (ref 3.4–10.8)

## 2023-02-23 LAB — CMP14+EGFR
ALT: 51 IU/L — ABNORMAL HIGH (ref 0–44)
AST: 29 IU/L (ref 0–40)
Albumin: 4.7 g/dL (ref 3.8–4.9)
Alkaline Phosphatase: 129 IU/L — ABNORMAL HIGH (ref 44–121)
BUN/Creatinine Ratio: 10 (ref 9–20)
BUN: 9 mg/dL (ref 6–24)
Bilirubin Total: 0.9 mg/dL (ref 0.0–1.2)
CO2: 26 mmol/L (ref 20–29)
Calcium: 10.4 mg/dL — ABNORMAL HIGH (ref 8.7–10.2)
Chloride: 98 mmol/L (ref 96–106)
Creatinine, Ser: 0.93 mg/dL (ref 0.76–1.27)
Globulin, Total: 2.5 g/dL (ref 1.5–4.5)
Glucose: 96 mg/dL (ref 70–99)
Potassium: 4.2 mmol/L (ref 3.5–5.2)
Sodium: 142 mmol/L (ref 134–144)
Total Protein: 7.2 g/dL (ref 6.0–8.5)
eGFR: 98 mL/min/{1.73_m2} (ref >59)

## 2023-02-23 LAB — LIPID PANEL
Chol/HDL Ratio: 3 ratio (ref 0.0–5.0)
Cholesterol, Total: 176 mg/dL (ref 100–199)
HDL: 59 mg/dL (ref >39)
LDL Chol Calc (NIH): 96 mg/dL (ref 0–99)
Triglycerides: 116 mg/dL (ref 0–149)
VLDL Cholesterol Cal: 21 mg/dL (ref 5–40)

## 2023-02-23 LAB — HEMOGLOBIN A1C
Est. average glucose Bld gHb Est-mCnc: 100 mg/dL
Hgb A1c MFr Bld: 5.1 % (ref 4.8–5.6)

## 2023-02-23 LAB — VITAMIN D 25 HYDROXY (VIT D DEFICIENCY, FRACTURES): Vit D, 25-Hydroxy: 30.5 ng/mL (ref 30.0–100.0)

## 2023-02-23 LAB — PSA: Prostate Specific Ag, Serum: 1 ng/mL (ref 0.0–4.0)

## 2023-02-23 LAB — TSH: TSH: 0.895 u[IU]/mL (ref 0.450–4.500)

## 2023-02-28 ENCOUNTER — Telehealth: Payer: Self-pay | Admitting: Internal Medicine

## 2023-02-28 ENCOUNTER — Encounter (INDEPENDENT_AMBULATORY_CARE_PROVIDER_SITE_OTHER): Payer: Self-pay | Admitting: Gastroenterology

## 2023-02-28 ENCOUNTER — Ambulatory Visit (INDEPENDENT_AMBULATORY_CARE_PROVIDER_SITE_OTHER): Payer: Medicare HMO | Admitting: Gastroenterology

## 2023-02-28 VITALS — BP 134/83 | HR 62 | Temp 98.0°F | Ht 72.0 in | Wt 203.0 lb

## 2023-02-28 DIAGNOSIS — R195 Other fecal abnormalities: Secondary | ICD-10-CM | POA: Diagnosis not present

## 2023-02-28 DIAGNOSIS — K6289 Other specified diseases of anus and rectum: Secondary | ICD-10-CM

## 2023-02-28 LAB — TOXASSURE SELECT 13 (MW), URINE

## 2023-02-28 MED ORDER — PEG 3350-KCL-NA BICARB-NACL 420 G PO SOLR: 4000.0000 mL | Freq: Once | ORAL | 0 refills | Status: AC

## 2023-02-28 NOTE — Addendum Note (Signed)
Addended byTrena Platt on: 02/28/2023 02:56 PM   Modules accepted: Orders

## 2023-02-28 NOTE — Progress Notes (Signed)
Katrinka Blazing, M.D. Gastroenterology & Hepatology [facility] Gastroenterology [address] [place], [place] [address] Primary Care Physician: Anabel Halon, MD [address] [place] [address]  Referring MD: PCP  Chief Complaint: Positive FIT  History of Present Illness: Chad Foster is a 52 y.o. male with past medical history of GERD, hypertension, who presents for evaluation of positive FIT.  Patient reports that recently he has felt when he wipes after defecation, there is a small bump in his perianal area. Sometimes has seen some streaks of blood. Occasionally, he may feel there is some stool in his perianal area despite he wipe before and he may see some stool has come out. He usually does not strain a lot to have a bowel movement.  The patient denies having any nausea, vomiting, fever, chills, hematochezia, melena, hematemesis, abdominal distention, abdominal pain, diarrhea, jaundice, pruritus or weight loss.  Patient was referred to our clinic for positive FIT testing. He  states he had this performed by his insurance company a month ago.  Most recent labs from 02/21/2023 showed a CMP with ALT 51, AST 29, alkaline phosphatase 129, total bilirubin 0.9, normal renal function and electrolytes, CBC with hemoglobin of 18, WBC 10.5 and platelets 222  Last UJW:JXBJY Last Colonoscopy:never  FHx: brother hepatitis C, neg for any gastrointestinal/liver disease, father lung cancer, grandmother pancreatic cancer Social: neg smoking, alcohol or illicit drug use - only takes CBD gummies Surgical: cholecystectomy  Past Medical History: Past Medical History:  Diagnosis Date   Enlarged prostate    but no meds required at present   GERD (gastroesophageal reflux disease)    takes Dexilant daily   Hypertension    takes benicar daily   Insomnia    Weakness    bilateral legs d/t back problems    Past Surgical History: Past Surgical History:   Procedure Laterality Date   BACK SURGERY     2010, Dr. Danielle Dess, L5-S1   CHOLECYSTECTOMY     FINGER FRACTURE SURGERY Right    reattatched   MASS EXCISION  06/23/2012   Procedure: EXCISION MASS;  Surgeon: Dalia Heading, MD;  Location: [facility];  Service: General;  Laterality: N/A;  Excision Chest Wall Mass   NASAL SINUS SURGERY      Family History: Family History  Problem Relation Age of Onset   Cancer - Lung Father    Cancer Brother     Social History: Social History   Tobacco Use  Smoking Status Former   Current packs/day: 1.50   Average packs/day: 1.5 packs/day for 15.0 years (22.5 ttl pk-yrs)   Types: Cigars, Cigarettes  Smokeless Tobacco Current  Tobacco Comments   quit 16 yrs ago,smokes E-Cigs   Social History   Substance and Sexual Activity  Alcohol Use Yes   Comment: weekend beers   Social History   Substance and Sexual Activity  Drug Use Yes   Types: Marijuana    Allergies: No Known Allergies  Medications: Current Outpatient Medications  Medication Sig Dispense Refill   ALPRAZolam (XANAX) 1 MG tablet Take 1 mg by mouth 3 (three) times daily as needed for sleep.     BENICAR 40 MG tablet TAKE 1 TABLET AT BEDTIME (NEEDS OFFICE VISIT) 30 tablet 0   DULoxetine (CYMBALTA) 30 MG capsule Take 30 mg by mouth daily.     folic acid (FOLVITE) 1 MG tablet Take 1 mg by mouth daily.     OVER THE COUNTER MEDICATION Diltiazem mg  unknown one bid.     sildenafil (VIAGRA) 100 MG tablet Take 1 tablet (100 mg total) by mouth as needed for erectile dysfunction. 18 tablet 1   amLODipine (NORVASC) 5 MG tablet Take 1 tablet (5 mg total) by mouth daily. (Patient not taking: Reported on 02/28/2023) 30 tablet 1   No current facility-administered medications for this visit.    Review of Systems: GENERAL: negative for malaise, night sweats HEENT: No changes in hearing or vision, no nose bleeds or other nasal problems. NECK: Negative for lumps, goiter, pain and significant neck  swelling RESPIRATORY: Negative for cough, wheezing CARDIOVASCULAR: Negative for chest pain, leg swelling, palpitations, orthopnea GI: SEE HPI MUSCULOSKELETAL: Negative for joint pain or swelling, back pain, and muscle pain. SKIN: Negative for lesions, rash PSYCH: Negative for sleep disturbance, mood disorder and recent psychosocial stressors. HEMATOLOGY Negative for prolonged bleeding, bruising easily, and swollen nodes. ENDOCRINE: Negative for cold or heat intolerance, polyuria, polydipsia and goiter. NEURO: negative for tremor, gait imbalance, syncope and seizures. The remainder of the review of systems is noncontributory.   Physical Exam: BP 134/83 (BP Location: Left Arm, Patient Position: Sitting, Cuff Size: Large)   Pulse 62   Temp 98 F (36.7 C) (Temporal)   Ht 6' (1.829 m)   Wt 203 lb (92.1 kg)   BMI 27.53 kg/m  GENERAL: The patient is AO x3, in no acute distress. HEENT: Head is normocephalic and atraumatic. EOMI are intact. Mouth is well hydrated and without lesions. NECK: Supple. No masses LUNGS: Clear to auscultation. No presence of rhonchi/wheezing/rales. Adequate chest expansion HEART: RRR, normal s1 and s2. ABDOMEN: Soft, nontender, no guarding, no peritoneal signs, and nondistended. BS +. No masses. EXTREMITIES: Without any cyanosis, clubbing, rash, lesions or edema. NEUROLOGIC: AOx3, no focal motor deficit. SKIN: no jaundice, no rashes   Imaging/Labs: as above  I personally reviewed and interpreted the available labs, imaging and endoscopic files.  Impression and Plan: Chad Foster is a 52 y.o. male with past medical history of GERD, hypertension, who presents for evaluation of positive FIT.  Patient does not have any high risk factors for colorectal cancer malignancy and has been asymptomatic. Discussed FIT test results in detail, specifically what it means when the test is positive or negative.  Discussed that there is a possibility that even when the test  is positive there may not be a polyp found on colonoscopy. More than 50% of the office visit was dedicated to discussing the procedure, including the day of and risks involved. Patient understands what the procedure involves including the benefits and any risks. Patient understands alternatives to the proposed procedure. Risks including (but not limited to) bleeding, tearing of the lining (perforation), rupture of adjacent organs, problems with heart and lung function, infection, and medication reactions. A small percentage of complications may require surgery, hospitalization, repeat endoscopic procedure, and/or transfusion. A small percentage of polyps and other tumors may not be seen.  Patient has presented some occasional straining when moving his bowels which could be contributing to his anorectal symptoms.  He may benefit from starting MiraLAX on a regular basis.  - Schedule colonoscopy - Start taking Miralax 1 capful every day   All questions were answered.      Katrinka Blazing, MD Gastroenterology and Hepatology [facility] Gastroenterology

## 2023-02-28 NOTE — Addendum Note (Signed)
Addended by: Marlowe Shores on: 02/28/2023 01:56 PM   Modules accepted: Orders

## 2023-02-28 NOTE — Telephone Encounter (Signed)
Patient advised.

## 2023-02-28 NOTE — Telephone Encounter (Signed)
Patient came by the office saying he started to take the new blood pressure medicine that was givien  amLODipine (NORVASC) 5 MG tablet [[id number]] blood pressure been running higher 150/104. Pulse upper 90 just sitting still.Patient did take the new meds for 4 days. Patient has gone back to his old medicine diltiazem 240 mg been running 120/70 or 120/80  pulse 50 to 60  Call patient what to do next. [phone]

## 2023-02-28 NOTE — Patient Instructions (Signed)
Schedule colonoscopy Start taking Miralax 1 capful every day

## 2023-02-28 NOTE — H&P (View-Only) (Signed)
Katrinka Blazing, M.D. Gastroenterology & Hepatology [facility] Gastroenterology [address] [place], [place] [address] Primary Care Physician: Anabel Halon, MD [address] [place] [address]  Referring MD: PCP  Chief Complaint: Positive FIT  History of Present Illness: Chad Foster is a 52 y.o. male with past medical history of GERD, hypertension, who presents for evaluation of positive FIT.  Patient reports that recently he has felt when he wipes after defecation, there is a small bump in his perianal area. Sometimes has seen some streaks of blood. Occasionally, he may feel there is some stool in his perianal area despite he wipe before and he may see some stool has come out. He usually does not strain a lot to have a bowel movement.  The patient denies having any nausea, vomiting, fever, chills, hematochezia, melena, hematemesis, abdominal distention, abdominal pain, diarrhea, jaundice, pruritus or weight loss.  Patient was referred to our clinic for positive FIT testing. He  states he had this performed by his insurance company a month ago.  Most recent labs from 02/21/2023 showed a CMP with ALT 51, AST 29, alkaline phosphatase 129, total bilirubin 0.9, normal renal function and electrolytes, CBC with hemoglobin of 18, WBC 10.5 and platelets 222  Last UJW:JXBJY Last Colonoscopy:never  FHx: brother hepatitis C, neg for any gastrointestinal/liver disease, father lung cancer, grandmother pancreatic cancer Social: neg smoking, alcohol or illicit drug use - only takes CBD gummies Surgical: cholecystectomy  Past Medical History: Past Medical History:  Diagnosis Date   Enlarged prostate    but no meds required at present   GERD (gastroesophageal reflux disease)    takes Dexilant daily   Hypertension    takes benicar daily   Insomnia    Weakness    bilateral legs d/t back problems    Past Surgical History: Past Surgical History:   Procedure Laterality Date   BACK SURGERY     2010, Dr. Danielle Dess, L5-S1   CHOLECYSTECTOMY     FINGER FRACTURE SURGERY Right    reattatched   MASS EXCISION  06/23/2012   Procedure: EXCISION MASS;  Surgeon: Dalia Heading, MD;  Location: [facility];  Service: General;  Laterality: N/A;  Excision Chest Wall Mass   NASAL SINUS SURGERY      Family History: Family History  Problem Relation Age of Onset   Cancer - Lung Father    Cancer Brother     Social History: Social History   Tobacco Use  Smoking Status Former   Current packs/day: 1.50   Average packs/day: 1.5 packs/day for 15.0 years (22.5 ttl pk-yrs)   Types: Cigars, Cigarettes  Smokeless Tobacco Current  Tobacco Comments   quit 16 yrs ago,smokes E-Cigs   Social History   Substance and Sexual Activity  Alcohol Use Yes   Comment: weekend beers   Social History   Substance and Sexual Activity  Drug Use Yes   Types: Marijuana    Allergies: No Known Allergies  Medications: Current Outpatient Medications  Medication Sig Dispense Refill   ALPRAZolam (XANAX) 1 MG tablet Take 1 mg by mouth 3 (three) times daily as needed for sleep.     BENICAR 40 MG tablet TAKE 1 TABLET AT BEDTIME (NEEDS OFFICE VISIT) 30 tablet 0   DULoxetine (CYMBALTA) 30 MG capsule Take 30 mg by mouth daily.     folic acid (FOLVITE) 1 MG tablet Take 1 mg by mouth daily.     OVER THE COUNTER MEDICATION Diltiazem mg  unknown one bid.     sildenafil (VIAGRA) 100 MG tablet Take 1 tablet (100 mg total) by mouth as needed for erectile dysfunction. 18 tablet 1   amLODipine (NORVASC) 5 MG tablet Take 1 tablet (5 mg total) by mouth daily. (Patient not taking: Reported on 02/28/2023) 30 tablet 1   No current facility-administered medications for this visit.    Review of Systems: GENERAL: negative for malaise, night sweats HEENT: No changes in hearing or vision, no nose bleeds or other nasal problems. NECK: Negative for lumps, goiter, pain and significant neck  swelling RESPIRATORY: Negative for cough, wheezing CARDIOVASCULAR: Negative for chest pain, leg swelling, palpitations, orthopnea GI: SEE HPI MUSCULOSKELETAL: Negative for joint pain or swelling, back pain, and muscle pain. SKIN: Negative for lesions, rash PSYCH: Negative for sleep disturbance, mood disorder and recent psychosocial stressors. HEMATOLOGY Negative for prolonged bleeding, bruising easily, and swollen nodes. ENDOCRINE: Negative for cold or heat intolerance, polyuria, polydipsia and goiter. NEURO: negative for tremor, gait imbalance, syncope and seizures. The remainder of the review of systems is noncontributory.   Physical Exam: BP 134/83 (BP Location: Left Arm, Patient Position: Sitting, Cuff Size: Large)   Pulse 62   Temp 98 F (36.7 C) (Temporal)   Ht 6' (1.829 m)   Wt 203 lb (92.1 kg)   BMI 27.53 kg/m  GENERAL: The patient is AO x3, in no acute distress. HEENT: Head is normocephalic and atraumatic. EOMI are intact. Mouth is well hydrated and without lesions. NECK: Supple. No masses LUNGS: Clear to auscultation. No presence of rhonchi/wheezing/rales. Adequate chest expansion HEART: RRR, normal s1 and s2. ABDOMEN: Soft, nontender, no guarding, no peritoneal signs, and nondistended. BS +. No masses. EXTREMITIES: Without any cyanosis, clubbing, rash, lesions or edema. NEUROLOGIC: AOx3, no focal motor deficit. SKIN: no jaundice, no rashes   Imaging/Labs: as above  I personally reviewed and interpreted the available labs, imaging and endoscopic files.  Impression and Plan: Chad Foster is a 52 y.o. male with past medical history of GERD, hypertension, who presents for evaluation of positive FIT.  Patient does not have any high risk factors for colorectal cancer malignancy and has been asymptomatic. Discussed FIT test results in detail, specifically what it means when the test is positive or negative.  Discussed that there is a possibility that even when the test  is positive there may not be a polyp found on colonoscopy. More than 50% of the office visit was dedicated to discussing the procedure, including the day of and risks involved. Patient understands what the procedure involves including the benefits and any risks. Patient understands alternatives to the proposed procedure. Risks including (but not limited to) bleeding, tearing of the lining (perforation), rupture of adjacent organs, problems with heart and lung function, infection, and medication reactions. A small percentage of complications may require surgery, hospitalization, repeat endoscopic procedure, and/or transfusion. A small percentage of polyps and other tumors may not be seen.  Patient has presented some occasional straining when moving his bowels which could be contributing to his anorectal symptoms.  He may benefit from starting MiraLAX on a regular basis.  - Schedule colonoscopy - Start taking Miralax 1 capful every day   All questions were answered.      Katrinka Blazing, MD Gastroenterology and Hepatology [facility] Gastroenterology

## 2023-03-04 ENCOUNTER — Telehealth (INDEPENDENT_AMBULATORY_CARE_PROVIDER_SITE_OTHER): Payer: Self-pay | Admitting: Gastroenterology

## 2023-03-04 NOTE — Telephone Encounter (Signed)
Pt contacted and informed of pre op appt. Pt states he has a dentist appt at 10:30am that day and would like the appt later if possible. Gave pt pre op number to call and change time. Pt verbalized understanding

## 2023-03-08 ENCOUNTER — Other Ambulatory Visit: Payer: Self-pay

## 2023-03-08 ENCOUNTER — Encounter (HOSPITAL_COMMUNITY): Payer: Self-pay

## 2023-03-08 ENCOUNTER — Encounter (HOSPITAL_COMMUNITY)
Admission: RE | Admit: 2023-03-08 | Discharge: 2023-03-08 | Disposition: A | Discharge status: Home / Self Care | Payer: Medicare HMO | Source: Ambulatory Visit | Attending: Gastroenterology | Admitting: Gastroenterology

## 2023-03-09 ENCOUNTER — Other Ambulatory Visit: Payer: Self-pay | Admitting: Internal Medicine

## 2023-03-11 ENCOUNTER — Other Ambulatory Visit: Payer: Self-pay

## 2023-03-11 MED ORDER — ALPRAZOLAM 1 MG PO TABS
1.0000 mg | ORAL_TABLET | Freq: Three times a day (TID) | ORAL | 0 refills | Status: DC | PRN
  Filled 2023-03-11 – 2023-03-14 (×2): qty 90, 30d supply, fill #0

## 2023-03-11 MED ORDER — FOLIC ACID 1 MG PO TABS
1.0000 mg | ORAL_TABLET | Freq: Every day | ORAL | 0 refills | Status: DC
  Filled 2023-03-11 – 2023-03-14 (×2): qty 30, 30d supply, fill #0

## 2023-03-12 ENCOUNTER — Ambulatory Visit (HOSPITAL_COMMUNITY): Payer: Medicare HMO | Admitting: Certified Registered"

## 2023-03-12 ENCOUNTER — Ambulatory Visit (HOSPITAL_BASED_OUTPATIENT_CLINIC_OR_DEPARTMENT_OTHER): Payer: Medicare HMO | Admitting: Certified Registered"

## 2023-03-12 ENCOUNTER — Encounter (HOSPITAL_COMMUNITY)
Admission: RE | Disposition: A | Discharge status: Home / Self Care | Payer: Self-pay | Source: Ambulatory Visit | Attending: Gastroenterology

## 2023-03-12 ENCOUNTER — Encounter (HOSPITAL_COMMUNITY): Payer: Self-pay | Admitting: Gastroenterology

## 2023-03-12 ENCOUNTER — Ambulatory Visit (HOSPITAL_COMMUNITY)
Admission: RE | Admit: 2023-03-12 | Discharge: 2023-03-12 | Disposition: A | Discharge status: Home / Self Care | Payer: Medicare HMO | Source: Ambulatory Visit | Attending: Gastroenterology | Admitting: Gastroenterology

## 2023-03-12 DIAGNOSIS — K5730 Diverticulosis of large intestine without perforation or abscess without bleeding: Secondary | ICD-10-CM

## 2023-03-12 DIAGNOSIS — D128 Benign neoplasm of rectum: Secondary | ICD-10-CM | POA: Diagnosis not present

## 2023-03-12 DIAGNOSIS — Z1211 Encounter for screening for malignant neoplasm of colon: Secondary | ICD-10-CM

## 2023-03-12 DIAGNOSIS — K648 Other hemorrhoids: Secondary | ICD-10-CM | POA: Insufficient documentation

## 2023-03-12 DIAGNOSIS — I10 Essential (primary) hypertension: Secondary | ICD-10-CM | POA: Insufficient documentation

## 2023-03-12 DIAGNOSIS — D124 Benign neoplasm of descending colon: Secondary | ICD-10-CM | POA: Diagnosis not present

## 2023-03-12 DIAGNOSIS — D123 Benign neoplasm of transverse colon: Secondary | ICD-10-CM | POA: Diagnosis not present

## 2023-03-12 DIAGNOSIS — D122 Benign neoplasm of ascending colon: Secondary | ICD-10-CM | POA: Insufficient documentation

## 2023-03-12 DIAGNOSIS — K635 Polyp of colon: Secondary | ICD-10-CM

## 2023-03-12 DIAGNOSIS — G4730 Sleep apnea, unspecified: Secondary | ICD-10-CM | POA: Insufficient documentation

## 2023-03-12 DIAGNOSIS — Z87891 Personal history of nicotine dependence: Secondary | ICD-10-CM | POA: Insufficient documentation

## 2023-03-12 DIAGNOSIS — F419 Anxiety disorder, unspecified: Secondary | ICD-10-CM | POA: Diagnosis not present

## 2023-03-12 DIAGNOSIS — R195 Other fecal abnormalities: Secondary | ICD-10-CM | POA: Diagnosis not present

## 2023-03-12 HISTORY — PX: COLONOSCOPY WITH PROPOFOL: SHX5780

## 2023-03-12 HISTORY — PX: SUBMUCOSAL LIFTING INJECTION: SHX6855

## 2023-03-12 HISTORY — PX: HEMOSTASIS CLIP PLACEMENT: SHX6857

## 2023-03-12 HISTORY — PX: POLYPECTOMY: SHX5525

## 2023-03-12 LAB — HM COLONOSCOPY

## 2023-03-12 SURGERY — COLONOSCOPY WITH PROPOFOL
Anesthesia: General

## 2023-03-12 MED ORDER — DEXMEDETOMIDINE HCL IN NACL 80 MCG/20ML IV SOLN
INTRAVENOUS | Status: DC | PRN
Start: 2023-03-12 — End: 2023-03-12
  Administered 2023-03-12: 12 ug via INTRAVENOUS
  Administered 2023-03-12: 8 ug via INTRAVENOUS

## 2023-03-12 MED ORDER — LIDOCAINE HCL (CARDIAC) PF 100 MG/5ML IV SOSY
PREFILLED_SYRINGE | INTRAVENOUS | Status: DC | PRN
  Administered 2023-03-12: 50 mg via INTRAVENOUS

## 2023-03-12 MED ORDER — PROPOFOL 10 MG/ML IV BOLUS
INTRAVENOUS | Status: DC | PRN
  Administered 2023-03-12: 50 mg via INTRAVENOUS
  Administered 2023-03-12: 100 mg via INTRAVENOUS

## 2023-03-12 MED ORDER — PHENYLEPHRINE 80 MCG/ML (10ML) SYRINGE FOR IV PUSH (FOR BLOOD PRESSURE SUPPORT)
PREFILLED_SYRINGE | INTRAVENOUS | Status: DC | PRN
Start: 2023-03-12 — End: 2023-03-12
  Administered 2023-03-12 (×2): 160 ug via INTRAVENOUS
  Administered 2023-03-12: 80 ug via INTRAVENOUS
  Administered 2023-03-12: 160 ug via INTRAVENOUS

## 2023-03-12 MED ORDER — DEXMEDETOMIDINE HCL IN NACL 80 MCG/20ML IV SOLN
INTRAVENOUS | Status: AC
  Filled 2023-03-12: qty 20

## 2023-03-12 MED ORDER — PROPOFOL 500 MG/50ML IV EMUL
INTRAVENOUS | Status: DC | PRN
  Administered 2023-03-12: 250 ug/kg/min via INTRAVENOUS

## 2023-03-12 MED ORDER — PHENYLEPHRINE 80 MCG/ML (10ML) SYRINGE FOR IV PUSH (FOR BLOOD PRESSURE SUPPORT)
PREFILLED_SYRINGE | INTRAVENOUS | Status: AC
  Filled 2023-03-12: qty 10

## 2023-03-12 MED ORDER — LACTATED RINGERS IV SOLN
INTRAVENOUS | Status: DC | PRN
Start: 2023-03-12 — End: 2023-03-12

## 2023-03-12 NOTE — Anesthesia Procedure Notes (Signed)
Date/Time: 03/12/2023 9:01 AM  Performed by: Julian Reil, CRNAPre-anesthesia Checklist: Patient identified, Emergency Drugs available, Suction available and Patient being monitored Patient Re-evaluated:Patient Re-evaluated prior to induction Oxygen Delivery Method: Nasal cannula Induction Type: IV induction Placement Confirmation: positive ETCO2

## 2023-03-12 NOTE — Anesthesia Preprocedure Evaluation (Signed)
Anesthesia Evaluation  Patient identified by MRN, date of birth, ID band Patient awake    Reviewed: Allergy & Precautions, H&P , NPO status , Patient's Chart, lab work & pertinent test results, reviewed documented beta blocker date and time   Airway Mallampati: II  TM Distance: >3 FB Neck ROM: full    Dental no notable dental hx.    Pulmonary neg pulmonary ROS, sleep apnea , former smoker   Pulmonary exam normal breath sounds clear to auscultation       Cardiovascular Exercise Tolerance: Good hypertension, negative cardio ROS  Rhythm:regular Rate:Normal     Neuro/Psych  PSYCHIATRIC DISORDERS Anxiety     negative neurological ROS  negative psych ROS   GI/Hepatic negative GI ROS, Neg liver ROS,GERD  ,,  Endo/Other  negative endocrine ROS    Renal/GU negative Renal ROS  negative genitourinary   Musculoskeletal   Abdominal   Peds  Hematology negative hematology ROS (+)   Anesthesia Other Findings   Reproductive/Obstetrics negative OB ROS                             Anesthesia Physical Anesthesia Plan  ASA: 2  Anesthesia Plan: General   Post-op Pain Management:    Induction:   PONV Risk Score and Plan: Propofol infusion  Airway Management Planned:   Additional Equipment:   Intra-op Plan:   Post-operative Plan:   Informed Consent: I have reviewed the patients History and Physical, chart, labs and discussed the procedure including the risks, benefits and alternatives for the proposed anesthesia with the patient or authorized representative who has indicated his/her understanding and acceptance.     Dental Advisory Given  Plan Discussed with: CRNA  Anesthesia Plan Comments:        Anesthesia Quick Evaluation

## 2023-03-12 NOTE — Transfer of Care (Signed)
Immediate Anesthesia Transfer of Care Note  Patient: Chad Foster  Procedure(s) Performed: COLONOSCOPY WITH PROPOFOL POLYPECTOMY SUBMUCOSAL LIFTING INJECTION HEMOSTASIS CLIP PLACEMENT  Patient Location: Short Stay  Anesthesia Type:General  Level of Consciousness: drowsy  Airway & Oxygen Therapy: Patient Spontanous Breathing  Post-op Assessment: Report given to RN and Post -op Vital signs reviewed and stable  Post vital signs: Reviewed and stable  Last Vitals:  Vitals Value Taken Time  BP    Temp    Pulse    Resp    SpO2      Last Pain:  Vitals:   03/12/23 0854  TempSrc:   PainSc: 0-No pain      Patients Stated Pain Goal: 6 (03/12/23 0825)  Complications: No notable events documented.

## 2023-03-12 NOTE — Op Note (Signed)
[facility] Patient Name: Chad Foster Procedure Date: 03/12/2023 8:45 AM MRN: [id number] Date of Birth: [birth year] Attending MD: Katrinka Blazing , , [id number] CSN: [id number] Age: 52 Admit Type: Outpatient Procedure:                Colonoscopy Indications:              Positive fecal immunochemical test Providers:                Katrinka Blazing, Angelica Ran, Zena Amos Referring MD:              Medicines:                Monitored Anesthesia Care Complications:            No immediate complications. Estimated Blood Loss:     Estimated blood loss: none. Procedure:                Pre-Anesthesia Assessment:                           - Prior to the procedure, a History and Physical                            was performed, and patient medications, allergies                            and sensitivities were reviewed. The patient's                            tolerance of previous anesthesia was reviewed.                           - The risks and benefits of the procedure and the                            sedation options and risks were discussed with the                            patient. All questions were answered and informed                            consent was obtained.                           - ASA Grade Assessment: II - A patient with mild                            systemic disease.                           After obtaining informed consent, the colonoscope                            was passed under direct vision. Throughout the                            procedure, the patient's blood pressure, pulse,  and                            oxygen saturations were monitored continuously. The                            [id number]-HQ190L ([id number]) scope was introduced through                            the anus and advanced to the the cecum, identified                            by appendiceal orifice and ileocecal valve. The                            colonoscopy was  performed without difficulty. The                            patient tolerated the procedure well. The quality                            of the bowel preparation was good.                           22 Modifier was used because the patient underwent                            large polypectomy x2, one of the polypectomies was                            performed via EMR. Required polypectomy of total of                            a total of 7 polyps. Scope In: 9:00:23 AM Scope Out: 9:36:53 AM Scope Withdrawal Time: 0 hours 30 minutes 1 second  Total Procedure Duration: 0 hours 36 minutes 30 seconds  Findings:      The perianal and digital rectal examinations were normal.      A 15 mm polyp was found in the proximal ascending colon. The polyp was       semi-sessile. Area was successfully injected with 3 mL Eleview for a       lift polypectomy. Imaging was performed using white light and narrow       band imaging to visualize the mucosa and demarcate the polyp site after       injection for EMR purposes. The polyp was removed with a hot snare.       Resection and retrieval were complete. To prevent bleeding after the       polypectomy, two hemostatic clips were successfully placed. Clip       manufacturer: [employer]. There was no bleeding at the end of the       procedure.      Two sessile polyps were found in the ascending colon. The polyps were 3       to 6 mm in size. These polyps were removed with a cold snare. Resection  and retrieval were complete.      An 18 to 20 mm polyp was found in the transverse colon. The polyp was       pedunculated. The polyp was removed with a hot snare. Resection and       retrieval were complete.      Three sessile polyps were found in the rectum and descending colon. The       polyps were 3 to 6 mm in size. These polyps were removed with a cold       snare. Resection and retrieval were complete.      A few small-mouthed diverticula were  found in the sigmoid colon and       descending colon.      Non-bleeding internal hemorrhoids were found during retroflexion. The       hemorrhoids were small. Impression:               - One 15 mm polyp in the proximal ascending colon,                            removed with a hot snare. Resected and retrieved                            via EMR. Clips were placed. Clip manufacturer:                            [employer].                           - Two 3 to 6 mm polyps in the ascending colon,                            removed with a cold snare. Resected and retrieved.                           - One 18 to 20 mm polyp in the transverse colon,                            removed with a hot snare. Resected and retrieved.                           - Three 3 to 6 mm polyps in the rectum and in the                            descending colon, removed with a cold snare.                            Resected and retrieved.                           - Diverticulosis in the sigmoid colon and in the                            descending colon.                           -  Non-bleeding internal hemorrhoids. Moderate Sedation:      Per Anesthesia Care Recommendation:           - Discharge patient to home (ambulatory).                           - Resume previous diet.                           - Await pathology results.                           - Repeat colonoscopy in 3 years for surveillance. Procedure Code(s):        --- Professional ---                           [id number], 22,59, Colonoscopy, flexible; with removal                            of tumor(s), polyp(s), or other lesion(s) by snare                            technique                           45381, Colonoscopy, flexible; with directed                            submucosal injection(s), any substance Diagnosis Code(s):        --- Professional ---                           D12.2, Benign neoplasm of ascending colon                            D12.3, Benign neoplasm of transverse colon (hepatic                            flexure or splenic flexure)                           D12.8, Benign neoplasm of rectum                           D12.4, Benign neoplasm of descending colon                           K64.8, Other hemorrhoids                           R19.5, Other fecal abnormalities                           K57.30, Diverticulosis of large intestine without                            perforation or abscess without bleeding CPT copyright 2022 American Medical Association. All rights reserved. The codes documented  in this report are preliminary and upon coder review may  be revised to meet current compliance requirements. Katrinka Blazing, MD Katrinka Blazing,  03/12/2023 9:53:22 AM This report has been signed electronically. Number of Addenda: 0

## 2023-03-12 NOTE — Interval H&P Note (Signed)
History and Physical Interval Note:  03/12/2023 7:35 AM  Chad Foster  has presented today for surgery, with the diagnosis of positive FIT.  The various methods of treatment have been discussed with the patient and family. After consideration of risks, benefits and other options for treatment, the patient has consented to  Procedure(s) with comments: COLONOSCOPY WITH PROPOFOL (N/A) - 9:30;asa 1 as a surgical intervention.  The patient's history has been reviewed, patient examined, no change in status, stable for surgery.  I have reviewed the patient's chart and labs.  Questions were answered to the patient's satisfaction.     Katrinka Blazing [name]

## 2023-03-12 NOTE — Discharge Instructions (Signed)
You are being discharged to home.  Resume your previous diet.  We are waiting for your pathology results.  Your physician has recommended a repeat colonoscopy in three years for surveillance.  

## 2023-03-13 ENCOUNTER — Encounter: Payer: Self-pay | Admitting: Internal Medicine

## 2023-03-13 ENCOUNTER — Encounter (INDEPENDENT_AMBULATORY_CARE_PROVIDER_SITE_OTHER): Payer: Self-pay | Admitting: *Deleted

## 2023-03-13 ENCOUNTER — Telehealth: Payer: Self-pay | Admitting: Internal Medicine

## 2023-03-13 LAB — SURGICAL PATHOLOGY

## 2023-03-13 NOTE — Telephone Encounter (Signed)
Patient calling says his pharmacy on MyChart defaulted to [facility]- says he can go pick it up there this time but is needing it changed to [place] for future reference. Please advise, thank you

## 2023-03-14 ENCOUNTER — Encounter: Payer: Self-pay | Admitting: Internal Medicine

## 2023-03-14 ENCOUNTER — Encounter (INDEPENDENT_AMBULATORY_CARE_PROVIDER_SITE_OTHER): Payer: Self-pay | Admitting: *Deleted

## 2023-03-14 ENCOUNTER — Other Ambulatory Visit: Payer: Self-pay

## 2023-03-15 ENCOUNTER — Other Ambulatory Visit: Payer: Self-pay

## 2023-03-15 MED ORDER — FOLIC ACID 1 MG PO TABS: 1.0000 mg | ORAL_TABLET | Freq: Every day | ORAL | 0 refills | Status: DC

## 2023-03-15 NOTE — Anesthesia Postprocedure Evaluation (Signed)
Anesthesia Post Note  Patient: Chad Foster  Procedure(s) Performed: COLONOSCOPY WITH PROPOFOL POLYPECTOMY SUBMUCOSAL LIFTING INJECTION HEMOSTASIS CLIP PLACEMENT  Patient location during evaluation: Phase II Anesthesia Type: General Level of consciousness: awake Pain management: pain level controlled Vital Signs Assessment: post-procedure vital signs reviewed and stable Respiratory status: spontaneous breathing and respiratory function stable Cardiovascular status: blood pressure returned to baseline and stable Postop Assessment: no headache and no apparent nausea or vomiting Anesthetic complications: no Comments: Late entry   No notable events documented.   Last Vitals:  Vitals:   03/12/23 0940 03/12/23 0949  BP: (!) 92/49 110/66  Pulse: (!) 51   Resp: (!) 21   Temp: 36.6 C   SpO2: 96%     Last Pain:  Vitals:   03/13/23 1502  TempSrc:   PainSc: 0-No pain                 Windell Norfolk

## 2023-03-19 ENCOUNTER — Encounter (HOSPITAL_COMMUNITY): Payer: Self-pay | Admitting: Gastroenterology

## 2023-03-19 ENCOUNTER — Other Ambulatory Visit: Payer: Self-pay | Admitting: Internal Medicine

## 2023-03-19 MED ORDER — DULOXETINE HCL 30 MG PO CPEP: 30.0000 mg | ORAL_CAPSULE | Freq: Every day | ORAL | 0 refills | Status: DC

## 2023-03-29 ENCOUNTER — Encounter: Payer: Self-pay | Admitting: Internal Medicine

## 2023-03-29 ENCOUNTER — Other Ambulatory Visit: Payer: Self-pay | Admitting: Internal Medicine

## 2023-03-29 MED ORDER — DILTIAZEM HCL ER COATED BEADS 240 MG PO CP24: 240.0000 mg | ORAL_CAPSULE | Freq: Two times a day (BID) | ORAL | 0 refills | Status: DC

## 2023-04-14 ENCOUNTER — Other Ambulatory Visit: Payer: Self-pay | Admitting: Internal Medicine

## 2023-04-14 DIAGNOSIS — F411 Generalized anxiety disorder: Secondary | ICD-10-CM

## 2023-04-15 ENCOUNTER — Other Ambulatory Visit: Payer: Self-pay

## 2023-04-15 MED ORDER — ALPRAZOLAM 1 MG PO TABS
1.0000 mg | ORAL_TABLET | Freq: Three times a day (TID) | ORAL | 0 refills | Status: DC | PRN
Start: 2023-04-15 — End: 2023-05-25

## 2023-04-29 ENCOUNTER — Ambulatory Visit (INDEPENDENT_AMBULATORY_CARE_PROVIDER_SITE_OTHER): Payer: Medicare HMO | Admitting: Internal Medicine

## 2023-04-29 ENCOUNTER — Encounter: Payer: Self-pay | Admitting: Internal Medicine

## 2023-04-29 VITALS — BP 144/88 | HR 63 | Ht 72.0 in | Wt 211.8 lb

## 2023-04-29 DIAGNOSIS — I10 Essential (primary) hypertension: Secondary | ICD-10-CM | POA: Diagnosis not present

## 2023-04-29 DIAGNOSIS — G4733 Obstructive sleep apnea (adult) (pediatric): Secondary | ICD-10-CM | POA: Diagnosis not present

## 2023-04-29 DIAGNOSIS — Z0001 Encounter for general adult medical examination with abnormal findings: Secondary | ICD-10-CM | POA: Diagnosis not present

## 2023-04-29 DIAGNOSIS — F411 Generalized anxiety disorder: Secondary | ICD-10-CM

## 2023-04-29 DIAGNOSIS — D751 Secondary polycythemia: Secondary | ICD-10-CM | POA: Insufficient documentation

## 2023-04-29 MED ORDER — AMLODIPINE-OLMESARTAN 5-40 MG PO TABS
1.0000 | ORAL_TABLET | Freq: Every day | ORAL | 3 refills | Status: DC
Start: 2023-04-29 — End: 2023-06-07

## 2023-04-29 NOTE — Assessment & Plan Note (Signed)
Likely due to OSA Recheck CBC - if persistently elevated Hb, may advise Phlebotomy

## 2023-04-29 NOTE — Assessment & Plan Note (Signed)
Well-controlled with Cymbalta 30 mg QD and Xanax 1 mg 3 times daily Advised to keep Xanax dose to maximum 3 times daily Advised to avoid marijuana products

## 2023-04-29 NOTE — Assessment & Plan Note (Signed)
Untreated sleep apnea likely contributing to elevated BP and also periodic limb movement Referred to sleep specialist

## 2023-04-29 NOTE — Progress Notes (Signed)
Established Patient Office Visit  Subjective:  Patient ID: Chad Foster, male    DOB: [birth year]  Age: 52 y.o. MRN: [id number]  CC:  Chief Complaint  Patient presents with   Annual Exam    HPI Chad Foster is a 52 y.o. male with past medical history of HTN, OSA, GAD and lumbar spinal stenosis who presents for annual physical.  HTN: His blood pressure was elevated today.  He currently takes  diltiazem 240 mg twice daily.  He was placed on amlodipine 5 mg QD in the last visit, and was supposed to continue olmesartan, but instead, he was taking amlodipine and diltiazem which led to uncontrolled BP at home. Later, he started taking diltiazem only with better BP readings (?). His HR remains in lower 60s. Denies any headache, dizziness, chest pain, dyspnea or palpitations.  OSA: He has history of sleep apnea, but does not use CPAP device as he did not get proper rest even with CPAP use.  Last sleep study was in 2015.  He also reports periodic limb movements at nighttime and has been told of restless leg syndrome.  He has tried pramipexole without much relief.   GAD: He takes Xanax 1 mg 3 times daily now and still takes Cymbalta 30 mg QD.  Denies anhedonia, SI or HI currently. His sleep has remained the same since decreasing Xanax dose at bedtime to 1 mg from 2 mg.   Lumbar foraminal stenosis: Reports lumbar spine surgery x 2.  He has chronic low back pain, but is manageable currently.  He has intermittent numbness of the feet as well.  Denies saddle anesthesia, urinary or stool incontinence. He takes THC gummies for pain relief, prefers to avoid opioid medicines.  Past Medical History:  Diagnosis Date   Enlarged prostate    but no meds required at present   GERD (gastroesophageal reflux disease)    takes Dexilant daily   Hypertension    takes benicar daily   Insomnia    Weakness    bilateral legs d/t back problems    Past Surgical History:  Procedure Laterality Date   BACK  SURGERY     2010, Dr. Danielle Dess, L5-S1   CHOLECYSTECTOMY     COLONOSCOPY WITH PROPOFOL N/A 03/12/2023   Procedure: COLONOSCOPY WITH PROPOFOL;  Surgeon: Dolores Frame, MD;  Location: [facility];  Service: Gastroenterology;  Laterality: N/A;  9:30;asa 1   FINGER FRACTURE SURGERY Right    reattatched   HEMOSTASIS CLIP PLACEMENT  03/12/2023   Procedure: HEMOSTASIS CLIP PLACEMENT;  Surgeon: Dolores Frame, MD;  Location: [facility];  Service: Gastroenterology;;   MASS EXCISION  06/23/2012   Procedure: EXCISION MASS;  Surgeon: Dalia Heading, MD;  Location: [facility];  Service: General;  Laterality: N/A;  Excision Chest Wall Mass   NASAL SINUS SURGERY     POLYPECTOMY  03/12/2023   Procedure: POLYPECTOMY;  Surgeon: Dolores Frame, MD;  Location: [facility];  Service: Gastroenterology;;   Brooke Dare INJECTION  03/12/2023   Procedure: SUBMUCOSAL LIFTING INJECTION;  Surgeon: Dolores Frame, MD;  Location: [facility];  Service: Gastroenterology;;    Family History  Problem Relation Age of Onset   Cancer - Lung Father    Cancer Brother     Social History   Socioeconomic History   Marital status: Significant Other    Spouse name: [name]   Number of children: 2   Years of education: 12   Highest education level:  12th grade  Occupational History    Comment: not employed  Tobacco Use   Smoking status: Former    Current packs/day: 1.50    Average packs/day: 1.5 packs/day for 15.0 years (22.5 ttl pk-yrs)    Types: Cigars, Cigarettes   Smokeless tobacco: Current   Tobacco comments:    quit 16 yrs ago,smokes E-Cigs  Vaping Use   Vaping status: Former  Substance and Sexual Activity   Alcohol use: Yes    Comment: weekend beers   Drug use: Yes    Types: Marijuana    Comment: CBD gummies   Sexual activity: Yes    Birth control/protection: None  Other Topics Concern   Not on file  Social History Narrative   Patient is right handed  and consumes 2-3 sodas daily   Social Determinants of Health   Financial Resource Strain: Medium Risk (04/28/2023)   Overall Financial Resource Strain (CARDIA)    Difficulty of Paying Living Expenses: Somewhat hard  Food Insecurity: Food Insecurity Present (04/28/2023)   Hunger Vital Sign    Worried About Running Out of Food in the Last Year: Sometimes true    Ran Out of Food in the Last Year: Sometimes true  Transportation Needs: No Transportation Needs (04/28/2023)   PRAPARE - Administrator, Civil Service (Medical): No    Lack of Transportation (Non-Medical): No  Physical Activity: Insufficiently Active (04/28/2023)   Exercise Vital Sign    Days of Exercise per Week: 2 days    Minutes of Exercise per Session: 50 min  Stress: Stress Concern Present (04/28/2023)   [employer] of Occupational Health - Occupational Stress Questionnaire    Feeling of Stress : Rather much  Social Connections: Socially Isolated (04/28/2023)   Social Connection and Isolation Panel [NHANES]    Frequency of Communication with Friends and Family: Three times a week    Frequency of Social Gatherings with Friends and Family: Never    Attends Religious Services: Never    Database administrator or Organizations: No    Attends Engineer, structural: Not on file    Marital Status: Divorced  Catering manager Violence: Not on file    Outpatient Medications Prior to Visit  Medication Sig Dispense Refill   ALPRAZolam (XANAX) 1 MG tablet Take 1 tablet (1 mg total) by mouth 3 (three) times daily as needed for sleep or anxiety. 90 tablet 0   DULoxetine (CYMBALTA) 30 MG capsule Take 1 capsule (30 mg total) by mouth daily. 90 capsule 0   folic acid (FOLVITE) 1 MG tablet Take 1 tablet (1 mg total) by mouth daily. 30 tablet 0   OVER THE COUNTER MEDICATION Diltiazem mg unknown one bid.     sildenafil (VIAGRA) 100 MG tablet Take 1 tablet (100 mg total) by mouth as needed for erectile dysfunction.  18 tablet 1   BENICAR 40 MG tablet TAKE 1 TABLET AT BEDTIME (NEEDS OFFICE VISIT) 30 tablet 0   diltiazem (CARDIZEM CD) 240 MG 24 hr capsule Take 1 capsule (240 mg total) by mouth 2 (two) times daily. 180 capsule 0   No facility-administered medications prior to visit.    No Known Allergies  ROS Review of Systems  Constitutional:  Negative for chills and fever.  HENT:  Negative for congestion and sore throat.   Eyes:  Negative for pain and discharge.  Respiratory:  Negative for cough and shortness of breath.   Cardiovascular:  Negative for chest pain and palpitations.  Gastrointestinal:  Negative for diarrhea, nausea and vomiting.  Endocrine: Negative for polydipsia and polyuria.  Genitourinary:  Negative for dysuria and hematuria.  Musculoskeletal:  Positive for back pain. Negative for neck pain and neck stiffness.  Skin:  Negative for rash.  Neurological:  Negative for dizziness and weakness.  Psychiatric/Behavioral:  Negative for agitation and behavioral problems. The patient is nervous/anxious.       Objective:    Physical Exam Vitals reviewed.  Constitutional:      General: He is not in acute distress.    Appearance: He is not diaphoretic.  HENT:     Head: Normocephalic and atraumatic.     Nose: Nose normal.     Mouth/Throat:     Mouth: Mucous membranes are moist.  Eyes:     General: No scleral icterus.    Extraocular Movements: Extraocular movements intact.  Cardiovascular:     Rate and Rhythm: Normal rate and regular rhythm.     Heart sounds: Normal heart sounds. No murmur heard. Pulmonary:     Breath sounds: Normal breath sounds. No wheezing or rales.  Abdominal:     Palpations: Abdomen is soft.     Tenderness: There is no abdominal tenderness.  Musculoskeletal:     Cervical back: Neck supple. No tenderness.     Right lower leg: No edema.     Left lower leg: No edema.  Skin:    General: Skin is warm.     Findings: No rash.  Neurological:     General: No  focal deficit present.     Mental Status: He is alert and oriented to person, place, and time.     Cranial Nerves: No cranial nerve deficit.     Sensory: No sensory deficit.     Motor: Weakness (B/l LE - 4/5) present.  Psychiatric:        Mood and Affect: Mood normal.        Behavior: Behavior normal.     BP (!) 144/88 (BP Location: Left Arm)   Pulse 63   Ht 6' (1.829 m)   Wt 211 lb 12.8 oz (96.1 kg)   SpO2 95%   BMI 28.73 kg/m  Wt Readings from Last 3 Encounters:  04/29/23 211 lb 12.8 oz (96.1 kg)  03/12/23 202 lb 13.2 oz (92 kg)  03/08/23 203 lb (92.1 kg)    Lab Results  Component Value Date   TSH 0.895 02/21/2023   Lab Results  Component Value Date   WBC 10.5 02/21/2023   HGB 18.0 (H) 02/21/2023   HCT 53.1 (H) 02/21/2023   MCV 96 02/21/2023   PLT 222 02/21/2023   Lab Results  Component Value Date   NA 142 02/21/2023   K 4.2 02/21/2023   CO2 26 02/21/2023   GLUCOSE 96 02/21/2023   BUN 9 02/21/2023   CREATININE 0.93 02/21/2023   BILITOT 0.9 02/21/2023   ALKPHOS 129 (H) 02/21/2023   AST 29 02/21/2023   ALT 51 (H) 02/21/2023   PROT 7.2 02/21/2023   ALBUMIN 4.7 02/21/2023   CALCIUM 10.4 (H) 02/21/2023   ANIONGAP 9 03/28/2017   EGFR 98 02/21/2023   Lab Results  Component Value Date   CHOL 176 02/21/2023   Lab Results  Component Value Date   HDL 59 02/21/2023   Lab Results  Component Value Date   LDLCALC 96 02/21/2023   Lab Results  Component Value Date   TRIG 116 02/21/2023   Lab Results  Component Value Date   CHOLHDL  3.0 02/21/2023   Lab Results  Component Value Date   HGBA1C 5.1 02/21/2023      Assessment & Plan:   Problem List Items Addressed This Visit       Cardiovascular and Mediastinum   Essential hypertension    BP Readings from Last 1 Encounters:  04/29/23 (!) 144/88   Uncontrolled with diltiazem to 240 mg twice daily Tried to switch to amlodipine 5 mg once daily, but had uncontrolled HTN -likely due to misunderstanding  about medication regimen  Switched to amlodipine-olmesartan 5-40 mg once daily, DC diltiazem Counseled for compliance with the medications Advised DASH diet and moderate exercise/walking, at least 150 mins/week       Relevant Medications   amLODipine-olmesartan (AZOR) 5-40 MG tablet   Other Relevant Orders   CMP14+EGFR     Respiratory   OSA (obstructive sleep apnea)    Untreated sleep apnea likely contributing to elevated BP and also periodic limb movement Referred to sleep specialist        Other   GAD (generalized anxiety disorder)    Well-controlled with Cymbalta 30 mg QD and Xanax 1 mg 3 times daily Advised to keep Xanax dose to maximum 3 times daily Advised to avoid marijuana products      Erythrocytosis    Likely due to OSA Recheck CBC - if persistently elevated Hb, may advise Phlebotomy      Relevant Orders   CBC with Differential/Platelet   Encounter for general adult medical examination with abnormal findings - Primary    Physical exam as documented. Counseling done  re healthy lifestyle involving commitment to 150 minutes exercise per week, heart healthy diet, and attaining healthy weight.The importance of adequate sleep also discussed. Immunization and cancer screening needs are specifically addressed at this visit.       Meds ordered this encounter  Medications   amLODipine-olmesartan (AZOR) 5-40 MG tablet    Sig: Take 1 tablet by mouth daily.    Dispense:  30 tablet    Refill:  3    Follow-up: Return in about 3 months (around 07/30/2023) for HTN.    Anabel Halon, MD

## 2023-04-29 NOTE — Assessment & Plan Note (Signed)
Physical exam as documented. Counseling done  re healthy lifestyle involving commitment to 150 minutes exercise per week, heart healthy diet, and attaining healthy weight.The importance of adequate sleep also discussed. Immunization and cancer screening needs are specifically addressed at this visit.

## 2023-04-29 NOTE — Assessment & Plan Note (Addendum)
BP Readings from Last 1 Encounters:  04/29/23 (!) 144/88   Uncontrolled with diltiazem to 240 mg twice daily Tried to switch to amlodipine 5 mg once daily, but had uncontrolled HTN -likely due to misunderstanding about medication regimen  Switched to amlodipine-olmesartan 5-40 mg once daily, DC diltiazem Counseled for compliance with the medications Advised DASH diet and moderate exercise/walking, at least 150 mins/week

## 2023-04-29 NOTE — Patient Instructions (Signed)
Please start taking Amlodipine-Olmesartan 5-40 mg once daily. Stop taking Diltiazem.  Please continue to take medications as prescribed.  Please continue to follow low salt diet and perform moderate exercise/walking at least 150 mins/week.

## 2023-05-03 ENCOUNTER — Encounter: Payer: Self-pay | Admitting: Internal Medicine

## 2023-05-06 ENCOUNTER — Other Ambulatory Visit: Payer: Self-pay | Admitting: Internal Medicine

## 2023-05-06 NOTE — Progress Notes (Unsigned)
@Patient  ID: Chad Foster, male    DOB: Sep 18, 1970, 52 y.o.   MRN: [id number]  No chief complaint on file.   Referring provider: Anabel Halon, MD  HPI: 52 year old male, former smoker. PMH HTN, OSA, esophageal reflux, RLS.    05/07/2023 Patient presents today for sleep consult.     Sleep questionnaire Symptoms-    Prior sleep study-  Bedtime- Time to fall asleep-  Nocturnal awakenings-  Out of bed/start of day-  Weight changes-  Do you operate heavy machinery-  Do you currently wear CPAP-  Do you current wear oxygen-  Epworth-     No Known Allergies  Immunization History  Administered Date(s) Administered   Zoster Recombinant(Shingrix) 06/15/2022, 08/17/2022    Past Medical History:  Diagnosis Date   Enlarged prostate    but no meds required at present   GERD (gastroesophageal reflux disease)    takes Dexilant daily   Hypertension    takes benicar daily   Insomnia    Weakness    bilateral legs d/t back problems    Tobacco History: Social History   Tobacco Use  Smoking Status Former   Current packs/day: 1.50   Average packs/day: 1.5 packs/day for 15.0 years (22.5 ttl pk-yrs)   Types: Cigars, Cigarettes  Smokeless Tobacco Current  Tobacco Comments   quit 16 yrs ago,smokes E-Cigs   Ready to quit: Not Answered Counseling given: Not Answered Tobacco comments: quit 16 yrs ago,smokes E-Cigs   Outpatient Medications Prior to Visit  Medication Sig Dispense Refill   ALPRAZolam (XANAX) 1 MG tablet Take 1 tablet (1 mg total) by mouth 3 (three) times daily as needed for sleep or anxiety. 90 tablet 0   amLODipine-olmesartan (AZOR) 5-40 MG tablet Take 1 tablet by mouth daily. 30 tablet 3   DULoxetine (CYMBALTA) 30 MG capsule Take 1 capsule (30 mg total) by mouth daily. 90 capsule 0   folic acid (FOLVITE) 1 MG tablet Take 1 tablet (1 mg total) by mouth daily. 30 tablet 0   OVER THE COUNTER MEDICATION Diltiazem mg unknown one bid.     sildenafil  (VIAGRA) 100 MG tablet Take 1 tablet (100 mg total) by mouth as needed for erectile dysfunction. 18 tablet 1   No facility-administered medications prior to visit.      Review of Systems  Review of Systems   Physical Exam  There were no vitals taken for this visit. Physical Exam   Lab Results:  CBC    Component Value Date/Time   WBC 10.5 02/21/2023 1357   WBC 9.8 03/28/2017 2107   RBC 5.53 02/21/2023 1357   RBC 5.10 03/28/2017 2107   HGB 18.0 (H) 02/21/2023 1357   HCT 53.1 (H) 02/21/2023 1357   PLT 222 02/21/2023 1357   MCV 96 02/21/2023 1357   MCH 32.5 02/21/2023 1357   MCH 32.9 03/28/2017 2107   MCHC 33.9 02/21/2023 1357   MCHC 35.9 03/28/2017 2107   RDW 12.4 02/21/2023 1357   LYMPHSABS 1.4 02/21/2023 1357   MONOABS 1.0 03/28/2017 2107   EOSABS 0.1 02/21/2023 1357   BASOSABS 0.1 02/21/2023 1357    BMET    Component Value Date/Time   NA 142 02/21/2023 1357   K 4.2 02/21/2023 1357   CL 98 02/21/2023 1357   CO2 26 02/21/2023 1357   GLUCOSE 96 02/21/2023 1357   GLUCOSE 107 (H) 03/28/2017 2107   BUN 9 02/21/2023 1357   CREATININE 0.93 02/21/2023 1357   CALCIUM 10.4 (H)  02/21/2023 1357   GFRNONAA >60 03/28/2017 2107   GFRAA >60 03/28/2017 2107    BNP No results found for: "BNP"  ProBNP No results found for: "PROBNP"  Imaging: No results found.   Assessment & Plan:   No problem-specific Assessment & Plan notes found for this encounter.     Glenford Bayley, NP 05/06/2023

## 2023-05-07 ENCOUNTER — Encounter: Payer: Self-pay | Admitting: Primary Care

## 2023-05-07 ENCOUNTER — Ambulatory Visit: Payer: Medicare HMO | Admitting: Primary Care

## 2023-05-07 VITALS — BP 148/86 | HR 62 | Temp 97.1°F | Ht 72.0 in | Wt 213.2 lb

## 2023-05-07 DIAGNOSIS — Z8669 Personal history of other diseases of the nervous system and sense organs: Secondary | ICD-10-CM

## 2023-05-07 DIAGNOSIS — G2581 Restless legs syndrome: Secondary | ICD-10-CM | POA: Diagnosis not present

## 2023-05-07 MED ORDER — GABAPENTIN 100 MG PO CAPS: 100.0000 mg | ORAL_CAPSULE | Freq: Every day | ORAL | 1 refills | Status: DC

## 2023-05-07 MED ORDER — FOLIC ACID 1 MG PO TABS: 1.0000 mg | ORAL_TABLET | Freq: Every day | ORAL | 0 refills | Status: DC

## 2023-05-07 NOTE — Patient Instructions (Addendum)
-  SLEEP APNEA: Sleep apnea is a condition where breathing repeatedly stops and starts during sleep. We will repeat an in-lab sleep study to reassess the severity of your sleep apnea and leg movements. Depending on the results, we may consider retrying CPAP therapy or discussing alternative treatments like the [facility] device.  -PERIODIC LIMB MOVEMENT DISORDER: Periodic limb movement disorder involves involuntary leg movements during sleep. This may be related to your nerve damage. We will consider restarting medication for your leg symptoms, such as Gabapentin or Lyrica, after we get the results of your sleep study.  -GENERAL HEALTH MAINTENANCE: Continue taking your current medications, Cymbalta and Xanax. Avoid alcohol at bedtime as it can worsen sleep apnea, and avoid driving when you feel tired.  INSTRUCTIONS: Please schedule an in-lab sleep study to reassess your sleep apnea and leg movements. Follow up with [place] after the sleep study to discuss the results and next steps.  Rx: Gabapentin 100mg  at bedtime (if not effective in 1-2 weeks send me a mychart message and we can increase dose)   Follow-up: 6-8 weeks with [facility] NP or sooner if needed

## 2023-05-25 ENCOUNTER — Other Ambulatory Visit: Payer: Self-pay | Admitting: Internal Medicine

## 2023-05-25 DIAGNOSIS — F411 Generalized anxiety disorder: Secondary | ICD-10-CM

## 2023-05-27 MED ORDER — ALPRAZOLAM 1 MG PO TABS: 1.0000 mg | ORAL_TABLET | Freq: Three times a day (TID) | ORAL | 2 refills | Status: DC | PRN

## 2023-06-07 ENCOUNTER — Other Ambulatory Visit: Payer: Self-pay | Admitting: Internal Medicine

## 2023-06-07 DIAGNOSIS — I10 Essential (primary) hypertension: Secondary | ICD-10-CM

## 2023-06-07 MED ORDER — AMLODIPINE-OLMESARTAN 10-40 MG PO TABS: 1.0000 | ORAL_TABLET | Freq: Every day | ORAL | 1 refills | Status: DC

## 2023-06-10 ENCOUNTER — Other Ambulatory Visit: Payer: Self-pay

## 2023-06-10 MED ORDER — FOLIC ACID 1 MG PO TABS: 1.0000 mg | ORAL_TABLET | Freq: Every day | ORAL | 0 refills | Status: DC

## 2023-06-18 ENCOUNTER — Other Ambulatory Visit: Payer: Self-pay | Admitting: Internal Medicine

## 2023-06-18 ENCOUNTER — Encounter: Payer: Self-pay | Admitting: Internal Medicine

## 2023-06-18 MED ORDER — DULOXETINE HCL 30 MG PO CPEP: 30.0000 mg | ORAL_CAPSULE | Freq: Every day | ORAL | 0 refills | Status: DC

## 2023-06-20 ENCOUNTER — Ambulatory Visit (HOSPITAL_BASED_OUTPATIENT_CLINIC_OR_DEPARTMENT_OTHER): Payer: Medicare HMO | Attending: Primary Care | Admitting: Internal Medicine

## 2023-06-20 DIAGNOSIS — Z8669 Personal history of other diseases of the nervous system and sense organs: Secondary | ICD-10-CM | POA: Diagnosis not present

## 2023-06-20 DIAGNOSIS — G4736 Sleep related hypoventilation in conditions classified elsewhere: Secondary | ICD-10-CM | POA: Insufficient documentation

## 2023-06-20 DIAGNOSIS — F5101 Primary insomnia: Secondary | ICD-10-CM | POA: Diagnosis not present

## 2023-06-20 DIAGNOSIS — G2581 Restless legs syndrome: Secondary | ICD-10-CM | POA: Insufficient documentation

## 2023-06-20 DIAGNOSIS — R0683 Snoring: Secondary | ICD-10-CM | POA: Insufficient documentation

## 2023-06-23 DIAGNOSIS — R0683 Snoring: Secondary | ICD-10-CM | POA: Diagnosis not present

## 2023-06-23 NOTE — Procedures (Signed)
    Patient Name: Gerado, Companion Date: 06/20/2023 Gender: Male D.O.B: [birth year] Age (years): 66 Referring Provider: Ames Dura NP Height (inches): 70 Interpreting Physician: Jetty Duhamel MD, ABSM Weight (lbs): 213 RPSGT: Cherylann Parr BMI: 31 MRN: [id number] Neck Size: 17.00  CLINICAL INFORMATION Sleep Study Type: NPSG Indication for sleep study: Obesity, Snoring, Witnesses Apnea / Gasping During Sleep Epworth Sleepiness Score: 6  SLEEP STUDY TECHNIQUE As per the AASM Manual for the Scoring of Sleep and Associated Events v2.3 (April 2016) with a hypopnea requiring 4% desaturations.  The channels recorded and monitored were frontal, central and occipital EEG, electrooculogram (EOG), submentalis EMG (chin), nasal and oral airflow, thoracic and abdominal wall motion, anterior tibialis EMG, snore microphone, electrocardiogram, and pulse oximetry.  MEDICATIONS Medications self-administered by patient taken the night of the study : none reported  SLEEP ARCHITECTURE The study was initiated at 10:05:54 PM and ended at 4:16:39 AM.  Sleep onset time was 106.9 minutes and the sleep efficiency was 49.0%. The total sleep time was 181.5 minutes.  Stage REM latency was 128.0 minutes.  The patient spent 3.9% of the night in stage N1 sleep, 87.1% in stage N2 sleep, 0.0% in stage N3 and 9.1% in REM.  Alpha intrusion was absent.  Supine sleep was 18.73%.  RESPIRATORY PARAMETERS The overall apnea/hypopnea index (AHI) was 2.0 per hour. There were 1 total apneas, including 1 obstructive, 0 central and 0 mixed apneas. There were 5 hypopneas and 2 RERAs.  The AHI during Stage REM sleep was 7.3 per hour.  AHI while supine was 5.3 per hour.  The mean oxygen saturation was 89.2%. The minimum SpO2 during sleep was 84.0%.  moderate snoring was noted during this study.  CARDIAC DATA The 2 lead EKG demonstrated sinus rhythm. The mean heart rate was 69.5 beats per minute. Other EKG  findings include: None.  LEG MOVEMENT DATA The total PLMS were 0 with a resulting PLMS index of 0.0. Associated arousal with leg movement index was 0.0 .  IMPRESSIONS - No significant obstructive sleep apnea occurred during this study (AHI = 2.0/h). - Oxygen desaturation was noted during this study (Min O2 = 84.0%, Mean 89%). Time with O2 saturation 88% or less was 49 minutes, indicating nocturnal hypoxemia. - The patient snored with moderate snoring volume. - No cardiac abnormalities were noted during this study. - Clinically significant periodic limb movements did not occur during sleep. No significant associated arousals. - Patient had difficulty initiating and maintaining sleep, with sleep onset neatr midnight and several episodes of spontaneous wakefulness lasting 15-20 minutes.Marland Kitchen  DIAGNOSIS - Nocturnal Hypoxemia (G47.36) - Difficulty initiating and maintaining sleep.  RECOMMENDATIONS - Consider managing for nocturnal hypoxemia. - Consider managing for insomnia. - Sleep hygiene should be reviewed to assess factors that may improve sleep quality. - Weight management and regular exercise should be initiated or continued if appropriate.  [Electronically signed] 06/23/2023 12:24 PM  Jetty Duhamel MD, ABSM Diplomate, American Board of Sleep Medicine NPI: [id number]                         Jetty Duhamel Diplomate, American Board of Sleep Medicine  ELECTRONICALLY SIGNED ON:  06/23/2023, 12:19 PM [facility] PH: [phone]   FX: [phone] ACCREDITED BY THE AMERICAN ACADEMY OF SLEEP MEDICINE

## 2023-06-26 NOTE — Telephone Encounter (Unsigned)
Copied from CRM [id number]. Topic: Appointments - Appointment Cancel/Reschedule >> Jun 26, 2023 10:03 AM Jorje Guild R wrote: Patient needs to change Pulmonary visit from 1030 am until a later time that day or reschedule for a different day.

## 2023-06-30 ENCOUNTER — Other Ambulatory Visit: Payer: Self-pay | Admitting: Primary Care

## 2023-07-01 ENCOUNTER — Encounter: Payer: Self-pay | Admitting: Primary Care

## 2023-07-01 ENCOUNTER — Ambulatory Visit (INDEPENDENT_AMBULATORY_CARE_PROVIDER_SITE_OTHER): Payer: Medicare HMO | Admitting: Primary Care

## 2023-07-01 ENCOUNTER — Ambulatory Visit
Admission: RE | Admit: 2023-07-01 | Discharge: 2023-07-01 | Disposition: A | Discharge status: Home / Self Care | Payer: Medicare HMO | Source: Ambulatory Visit | Attending: Primary Care

## 2023-07-01 VITALS — BP 134/85 | HR 92 | Temp 98.2°F | Ht 72.0 in | Wt 209.2 lb

## 2023-07-01 DIAGNOSIS — G4734 Idiopathic sleep related nonobstructive alveolar hypoventilation: Secondary | ICD-10-CM

## 2023-07-01 DIAGNOSIS — Z87891 Personal history of nicotine dependence: Secondary | ICD-10-CM | POA: Diagnosis not present

## 2023-07-01 DIAGNOSIS — G2581 Restless legs syndrome: Secondary | ICD-10-CM

## 2023-07-01 MED ORDER — GABAPENTIN 100 MG PO CAPS: 200.0000 mg | ORAL_CAPSULE | Freq: Every day | ORAL | 1 refills | Status: DC

## 2023-07-01 NOTE — Progress Notes (Signed)
@Patient  ID: Chad Foster, male    DOB: 06/22/1970, 53 y.o.   MRN: [id number]  No chief complaint on file.   Referring provider: Anabel Halon, MD  HPI: 53 year old male, former smoker. PMH HTN, OSA, esophageal reflux, RLS.   Previous [facility] pulmonary: 05/07/2023 Discussed the use of AI scribe software for clinical note transcription with the patient, who gave verbal consent to proceed.  History of Present Illness   The patient presents for a sleep consultation due to persistent sleep disturbances. He reports a history of bad dreams, frequent awakenings, and excessive daytime sleepiness. These symptoms have been ongoing for several years. Dx with moderate - severe sleep apnea in 2015 and was previous on CPAP for management. The patient discontinued the device due to perceived ineffectiveness and aggravation.  The patient also has a history of nerve damage in the legs, which was previously treated with Mirapex. He reports significant leg movement during sleep, which he believes is related to his nerve damage. However, he discontinued the medication as it seemed ineffective.  Recently, the patient's sleep disturbances have worsened. He falls asleep easily but experiences distressing dreams, often involving physical altercations, leading to physical movements such as kicking and swinging. These episodes are disruptive and have led to the patient sleeping separately from his partner. He reports leg pain, described as an ache as if he has been running, and occasional numbness during the day, which he attributes to his back condition. The patient sleeps mostly on his side due to discomfort when lying on his stomach due to rods in his lower back. He also reports occasional alcohol consumption on weekends.  He takes Cymbalta and Xanax for anxiety.      Sleep questionnaire Symptoms-   bad dreams, wakes up a lot, falls asleep easily Prior sleep study-2015 Bedtime-9 to 11 PM Time to fall asleep-10  minutes Nocturnal awakenings-4-5 times a night Out of bed/start of day-9 to 11 AM Weight changes-15 Do you operate heavy machinery-no Do you currently wear CPAP-no Do you current wear oxygen-no Epworth-10   1. Restless legs syndrome - Split night study; Future  2. Hx of sleep apnea - Split night study; Future  Sleep Apnea History of moderate to severe sleep apnea with non-compliance to CPAP therapy. Reports of easy sleep onset but frequent awakenings, nightmares, and physical activity during sleep suggestive of disrupted sleep architecture.  Reviewed risks of untreated sleep apnea including cardiac arrhythmias, pulmonary hypertension, diabetes and stroke.  We also discussed treatment options including weight loss, oral appliance, CPAP therapy referral to ENT for possible surgical options. -Repeat in-lab sleep study to reassess severity of sleep apnea and periodic limb movement disorder. -Consider retrying CPAP therapy or discuss alternative treatments such as Inspire device if sleep apnea is confirmed and severe.  Periodic Limb Movement Disorder History of severe leg kicking during sleep, possibly related to nerve damage in the legs.  -Start Gabapentin 100mg  at bedtime and can titrate dose as needed (other option is Lyrica)   General Health Maintenance -Continue current medications (Cymbalta and Xanax). -Advise to avoid alcohol at bedtime as it can worsen sleep apnea. -Advise to avoid driving when tired.    07/01/2023- Interim hx  Discussed the use of AI scribe software for clinical note transcription with the patient, who gave verbal consent to proceed.  Patient presents today for 6-8 week follow-up. During our last visit in December he was started on gabapentin 100mg  at bedtime for RLS. Ordered in-lab sleep study.  Patient had a split-night sleep study on 06/20/2023 that showed evidence of nocturnal hypoxemia that underlying OSA, AHI 2.0/h (Supine AHI 5.3 and REM AHI -7.3/hour).   SpO2 low 84%.  Patient spent 49 minutes with O2 level less than 88%. Sleep onset 106 minutes and sleep efficiency was 49%. Total sleep time 181 minutes.  Snored with moderate volume.  No cardiac abnormalities.  Clinically significant periodic limb movements did not occur during sleep. No significant arousals.  Patient had difficulty initiating and maintaining sleep.   He reports a significant improvement in symptoms, particularly the vivid nightmares and night terrors, which have decreased in intensity. However, he still experiences some nightmares and wakes up feeling nervous and jittery.  Despite this improvement, the patient continues to struggle with insomnia, having difficulty initiating and maintaining sleep. A recent sleep study did not show any periodic limb movement, but the patient's oxygen level did drop. The study did not confirm sleep apnea, but the patient's sleep time was limited, potentially affecting the results.  The patient has a history of heavy smoking, starting at age 76 and quitting in his late twenties or early thirties. He smoked approximately two to two and a half packs per day for about ten years. Currently, he reports occasional shortness of breath, particularly when he has been sedentary for extended periods due to back issues. He denies any cough, chest tightness, or wheezing.  The patient also takes Xanax, previously two at night but now reduced to one at night and one during the day as needed.     No Known Allergies  Immunization History  Administered Date(s) Administered   Zoster Recombinant(Shingrix) 06/15/2022, 08/17/2022    Past Medical History:  Diagnosis Date   Enlarged prostate    but no meds required at present   GERD (gastroesophageal reflux disease)    takes Dexilant daily   Hypertension    takes benicar daily   Insomnia    Weakness    bilateral legs d/t back problems    Tobacco History: Social History   Tobacco Use  Smoking Status Former    Current packs/day: 1.50   Average packs/day: 1.5 packs/day for 15.0 years (22.5 ttl pk-yrs)   Types: Cigars, Cigarettes  Smokeless Tobacco Current  Tobacco Comments   quit 16 yrs ago,smokes E-Cigs   Ready to quit: Not Answered Counseling given: Not Answered Tobacco comments: quit 16 yrs ago,smokes E-Cigs   Outpatient Medications Prior to Visit  Medication Sig Dispense Refill   amLODipine-olmesartan (AZOR) 10-40 MG tablet Take 1 tablet by mouth daily. 90 tablet 1   ALPRAZolam (XANAX) 1 MG tablet Take 1 tablet (1 mg total) by mouth 3 (three) times daily as needed for sleep or anxiety. 90 tablet 2   DULoxetine (CYMBALTA) 30 MG capsule Take 1 capsule (30 mg total) by mouth daily. 90 capsule 0   folic acid (FOLVITE) 1 MG tablet Take 1 tablet (1 mg total) by mouth daily. 30 tablet 0   gabapentin (NEURONTIN) 100 MG capsule Take 1 capsule (100 mg total) by mouth at bedtime. 30 capsule 1   OVER THE COUNTER MEDICATION Diltiazem mg unknown one bid.     sildenafil (VIAGRA) 100 MG tablet Take 1 tablet (100 mg total) by mouth as needed for erectile dysfunction. 18 tablet 1   No facility-administered medications prior to visit.   Review of Systems  Review of Systems  Constitutional: Negative.   HENT: Negative.    Respiratory:  Positive for shortness of breath.   Cardiovascular:  Negative.    Physical Exam  There were no vitals taken for this visit. Physical Exam Constitutional:      General: He is not in acute distress.    Appearance: Normal appearance. He is not ill-appearing.  HENT:     Head: Normocephalic and atraumatic.     Mouth/Throat:     Mouth: Mucous membranes are moist.     Pharynx: Oropharynx is clear.  Cardiovascular:     Rate and Rhythm: Normal rate and regular rhythm.  Pulmonary:     Effort: Pulmonary effort is normal.     Breath sounds: Normal breath sounds.  Musculoskeletal:        General: Normal range of motion.  Skin:    General: Skin is warm and dry.   Neurological:     General: No focal deficit present.     Mental Status: He is alert and oriented to person, place, and time. Mental status is at baseline.  Psychiatric:        Mood and Affect: Mood normal.        Behavior: Behavior normal.        Thought Content: Thought content normal.        Judgment: Judgment normal.      Lab Results:  CBC    Component Value Date/Time   WBC 10.5 02/21/2023 1357   WBC 9.8 03/28/2017 2107   RBC 5.53 02/21/2023 1357   RBC 5.10 03/28/2017 2107   HGB 18.0 (H) 02/21/2023 1357   HCT 53.1 (H) 02/21/2023 1357   PLT 222 02/21/2023 1357   MCV 96 02/21/2023 1357   MCH 32.5 02/21/2023 1357   MCH 32.9 03/28/2017 2107   MCHC 33.9 02/21/2023 1357   MCHC 35.9 03/28/2017 2107   RDW 12.4 02/21/2023 1357   LYMPHSABS 1.4 02/21/2023 1357   MONOABS 1.0 03/28/2017 2107   EOSABS 0.1 02/21/2023 1357   BASOSABS 0.1 02/21/2023 1357    BMET    Component Value Date/Time   NA 142 02/21/2023 1357   K 4.2 02/21/2023 1357   CL 98 02/21/2023 1357   CO2 26 02/21/2023 1357   GLUCOSE 96 02/21/2023 1357   GLUCOSE 107 (H) 03/28/2017 2107   BUN 9 02/21/2023 1357   CREATININE 0.93 02/21/2023 1357   CALCIUM 10.4 (H) 02/21/2023 1357   GFRNONAA >60 03/28/2017 2107   GFRAA >60 03/28/2017 2107    BNP No results found for: "BNP"  ProBNP No results found for: "PROBNP"  Imaging: Sleep Study Documents Result Date: 06/27/2023 Ordered by an unspecified provider.    Assessment & Plan:   1. Former smoker Scientist, physiological) - DG Chest 2 View; Future  2. Nocturnal hypoxia - DG Chest 2 View; Future  3. Restless legs syndrome     REM Sleep Disorder/RLS Improvement in vivid dreams and thrashing movements with Gabapentin 100mg . Some residual nightmares and anxiety upon waking. -Increase Gabapentin dose to 200mg   -Continue Xanax as prescribed by other provider   Insomnia Difficulty initiating and maintaining sleep noted on recent sleep study. -Increase Gabapentin dose  to potentially aid with insomnia.  Possible Sleep Apnea Recent sleep study inconclusive due to insufficient sleep time. History of sleep apnea and use of CPAP in the past. -Plan to repeat sleep study at home once insomnia is better managed.  Possible Hypoxemia A recent sleep study showed nocturnal oxygen levels did drop. The study did not confirm sleep apnea, but the patient's sleep time was limited, potentially affecting the results. History of heavy smoking. -Perform  walk test in office to assess oxygen levels during exertion. -Order chest X-ray due to history of smoking. -Consider further workup including breathing test and possible oxygen therapy depending on results of walk test, chest X-ray, and repeat sleep study.  Follow-up in 4-6 weeks to assess response to increased Gabapentin dose and plan for repeat sleep study.      Glenford Bayley, NP 07/01/2023

## 2023-07-01 NOTE — Patient Instructions (Addendum)
-  REM SLEEP DISORDER: REM sleep disorder is a condition where you experience vivid dreams and physical movements during sleep. We have seen improvement with Gabapentin, but you still have some nightmares and anxiety. We will increase your Gabapentin dose to help with these symptoms. Continue taking Xanax as prescribed by your other provider.  -INSOMNIA: Insomnia is difficulty falling or staying asleep. We will increase your Gabapentin dose to help improve your sleep.  -POSSIBLE SLEEP APNEA: Sleep apnea is a condition where breathing repeatedly stops and starts during sleep. Your recent sleep study was inconclusive, so we plan to repeat it at home once your insomnia is better managed.  -POSSIBLE HYPOXEMIA: Hypoxemia is low oxygen levels in the blood. Your oxygen levels dropped during the sleep study, which may be related to your history of smoking. We will perform a walk test in the office to check your oxygen levels during activity and order a chest X-ray. Depending on the results, we may do further tests and consider oxygen therapy.  Recommendations: - Take 2 tablets gabapentin (total 200mg ) at bedtime- notify office if you develop morning/daytime grogginess  - Plan repeat HST once you are sleeping better - Continue Xanax as directed for anxiety  Orders: - CXR today  - Ambulatory walk test on RA to assess oxygen desaturation  Follow-up: 4-6 weeks virtual visit with Beth NP for insomnia FU / or send me a mychart message and let me know how you are sleeping   INSTRUCTIONS:  Please follow up in 4-6 weeks to assess your response to the increased Gabapentin dose and to plan for the repeat sleep study.

## 2023-07-04 ENCOUNTER — Ambulatory Visit: Payer: Medicare HMO | Admitting: Primary Care

## 2023-07-19 ENCOUNTER — Other Ambulatory Visit: Payer: Self-pay

## 2023-07-19 DIAGNOSIS — Z720 Tobacco use: Secondary | ICD-10-CM

## 2023-07-19 DIAGNOSIS — R9389 Abnormal findings on diagnostic imaging of other specified body structures: Secondary | ICD-10-CM

## 2023-07-19 NOTE — Progress Notes (Signed)
Order PFT per Beth:  Please let patient know CXR showed coarse lungs markings which could be smoking related, recommend he have breathing test (please order PFTs [id number] abuse/abnormal cxr)   Order placed.

## 2023-07-23 DIAGNOSIS — D751 Secondary polycythemia: Secondary | ICD-10-CM | POA: Diagnosis not present

## 2023-07-23 DIAGNOSIS — I10 Essential (primary) hypertension: Secondary | ICD-10-CM | POA: Diagnosis not present

## 2023-07-24 ENCOUNTER — Other Ambulatory Visit: Payer: Self-pay | Admitting: Internal Medicine

## 2023-07-24 LAB — CBC WITH DIFFERENTIAL/PLATELET
Basophils Absolute: 0.1 10*3/uL (ref 0.0–0.2)
Basos: 1 %
EOS (ABSOLUTE): 0.2 10*3/uL (ref 0.0–0.4)
Eos: 2 %
Hematocrit: 48.3 % (ref 37.5–51.0)
Hemoglobin: 17.1 g/dL (ref 13.0–17.7)
Immature Grans (Abs): 0 10*3/uL (ref 0.0–0.1)
Immature Granulocytes: 0 %
Lymphocytes Absolute: 1.9 10*3/uL (ref 0.7–3.1)
Lymphs: 18 %
MCH: 32.3 pg (ref 26.6–33.0)
MCHC: 35.4 g/dL (ref 31.5–35.7)
MCV: 91 fL (ref 79–97)
Monocytes Absolute: 0.9 10*3/uL (ref 0.1–0.9)
Monocytes: 9 %
Neutrophils Absolute: 7.5 10*3/uL — ABNORMAL HIGH (ref 1.4–7.0)
Neutrophils: 70 %
Platelets: 239 10*3/uL (ref 150–450)
RBC: 5.3 x10E6/uL (ref 4.14–5.80)
RDW: 12 % (ref 11.6–15.4)
WBC: 10.7 10*3/uL (ref 3.4–10.8)

## 2023-07-24 LAB — CMP14+EGFR
ALT: 33 [IU]/L (ref 0–44)
AST: 23 [IU]/L (ref 0–40)
Albumin: 4.2 g/dL (ref 3.8–4.9)
Alkaline Phosphatase: 128 [IU]/L — ABNORMAL HIGH (ref 44–121)
BUN/Creatinine Ratio: 13 (ref 9–20)
BUN: 12 mg/dL (ref 6–24)
Bilirubin Total: 2 mg/dL — ABNORMAL HIGH (ref 0.0–1.2)
CO2: 26 mmol/L (ref 20–29)
Calcium: 9.7 mg/dL (ref 8.7–10.2)
Chloride: 101 mmol/L (ref 96–106)
Creatinine, Ser: 0.91 mg/dL (ref 0.76–1.27)
Globulin, Total: 2.5 g/dL (ref 1.5–4.5)
Glucose: 107 mg/dL — ABNORMAL HIGH (ref 70–99)
Potassium: 3.8 mmol/L (ref 3.5–5.2)
Sodium: 143 mmol/L (ref 134–144)
Total Protein: 6.7 g/dL (ref 6.0–8.5)
eGFR: 100 mL/min/{1.73_m2} (ref >59)

## 2023-07-31 ENCOUNTER — Encounter: Payer: Self-pay | Admitting: Internal Medicine

## 2023-07-31 ENCOUNTER — Ambulatory Visit (INDEPENDENT_AMBULATORY_CARE_PROVIDER_SITE_OTHER): Payer: Medicare HMO | Admitting: Internal Medicine

## 2023-07-31 VITALS — BP 129/80 | HR 85 | Ht 72.0 in | Wt 208.4 lb

## 2023-07-31 DIAGNOSIS — G4733 Obstructive sleep apnea (adult) (pediatric): Secondary | ICD-10-CM | POA: Diagnosis not present

## 2023-07-31 DIAGNOSIS — G2581 Restless legs syndrome: Secondary | ICD-10-CM | POA: Diagnosis not present

## 2023-07-31 DIAGNOSIS — F411 Generalized anxiety disorder: Secondary | ICD-10-CM | POA: Diagnosis not present

## 2023-07-31 DIAGNOSIS — D751 Secondary polycythemia: Secondary | ICD-10-CM

## 2023-07-31 DIAGNOSIS — I10 Essential (primary) hypertension: Secondary | ICD-10-CM

## 2023-07-31 MED ORDER — DULOXETINE HCL 30 MG PO CPEP
30.0000 mg | ORAL_CAPSULE | Freq: Every day | ORAL | 1 refills | Status: DC
Start: 2023-07-31 — End: 2024-03-06

## 2023-07-31 MED ORDER — GABAPENTIN 300 MG PO CAPS: 300.0000 mg | ORAL_CAPSULE | Freq: Every day | ORAL | 5 refills | Status: DC

## 2023-07-31 NOTE — Patient Instructions (Signed)
 Please start taking Gabapentin 300 mg instead of 200 mg.  Please continue to take medications as prescribed.  Please continue to follow low carb diet and perform moderate exercise/walking at least 150 mins/week.  Please get fasting blood tests done before the next visit.

## 2023-07-31 NOTE — Assessment & Plan Note (Signed)
 Likely due to OSA Hb ~ 17 Recheck CBC - if persistently elevated Hb, may advise Phlebotomy

## 2023-07-31 NOTE — Assessment & Plan Note (Signed)
 BP Readings from Last 1 Encounters:  07/31/23 129/80   Well controlled with amlodipine-olmesartan 10-40 mg once daily, DC diltiazem as it was inadequate Counseled for compliance with the medications Advised DASH diet and moderate exercise/walking, at least 150 mins/week

## 2023-07-31 NOTE — Progress Notes (Signed)
 Established Patient Office Visit  Subjective:  Patient ID: Chad Foster, male    DOB: June 10, 1970  Age: 53 y.o. MRN: [id number]  CC:  Chief Complaint  Patient presents with   Hypertension    Three month follow up     HPI Chad Foster is a 53 y.o. male with past medical history of HTN, OSA, GAD and lumbar spinal stenosis who presents for f/u of his chronic medical conditions.  HTN: His blood pressure was wnl today.  He currently takes Azor 10-40 mg once daily. Used to take diltiazem 240 mg twice daily, which was not adequate. Denies any headache, dizziness, chest pain, dyspnea or palpitations.  OSA: He has history of sleep apnea, but does not use CPAP device as he did not get proper rest even with CPAP use.  Last sleep study was inconclusive and is planned to get repeat sleep study, followed by Pulmonology.  He also reports periodic limb movements at nighttime and has felt better with Gabapentin. He has tried pramipexole without much relief.  GAD: He takes Xanax 1 mg 3 times daily now and still takes Cymbalta 30 mg QD.  Denies anhedonia, SI or HI currently. His sleep has remained the same since decreasing Xanax dose at bedtime to 1 mg from 2 mg.   Lumbar foraminal stenosis: Reports lumbar spine surgery x 2.  He has chronic low back pain, but is manageable currently.  He has intermittent numbness of the feet as well.  Denies saddle anesthesia, urinary or stool incontinence. He takes THC gummies for pain relief, prefers to avoid opioid medicines.  Past Medical History:  Diagnosis Date   Enlarged prostate    but no meds required at present   GERD (gastroesophageal reflux disease)    takes Dexilant daily   Hypertension    takes benicar daily   Insomnia    Weakness    bilateral legs d/t back problems    Past Surgical History:  Procedure Laterality Date   BACK SURGERY     2010, Dr. Danielle Dess, L5-S1   CHOLECYSTECTOMY     COLONOSCOPY WITH PROPOFOL N/A 03/12/2023   Procedure:  COLONOSCOPY WITH PROPOFOL;  Surgeon: Dolores Frame, MD;  Location: [facility];  Service: Gastroenterology;  Laterality: N/A;  9:30;asa 1   FINGER FRACTURE SURGERY Right    reattatched   HEMOSTASIS CLIP PLACEMENT  03/12/2023   Procedure: HEMOSTASIS CLIP PLACEMENT;  Surgeon: Dolores Frame, MD;  Location: [facility];  Service: Gastroenterology;;   MASS EXCISION  06/23/2012   Procedure: EXCISION MASS;  Surgeon: Dalia Heading, MD;  Location: [facility];  Service: General;  Laterality: N/A;  Excision Chest Wall Mass   NASAL SINUS SURGERY     POLYPECTOMY  03/12/2023   Procedure: POLYPECTOMY;  Surgeon: Dolores Frame, MD;  Location: [facility];  Service: Gastroenterology;;   Brooke Dare INJECTION  03/12/2023   Procedure: SUBMUCOSAL LIFTING INJECTION;  Surgeon: Dolores Frame, MD;  Location: [facility];  Service: Gastroenterology;;    Family History  Problem Relation Age of Onset   Cancer - Lung Father    Cancer Brother     Social History   Socioeconomic History   Marital status: Significant Other    Spouse name: [name]   Number of children: 2   Years of education: 12   Highest education level: Some college, no degree  Occupational History    Comment: not employed  Tobacco Use   Smoking status: Former  Current packs/day: 1.50    Average packs/day: 1.5 packs/day for 15.0 years (22.5 ttl pk-yrs)    Types: Cigars, Cigarettes   Smokeless tobacco: Current   Tobacco comments:    quit 16 yrs ago,smokes E-Cigs  Vaping Use   Vaping status: Former  Substance and Sexual Activity   Alcohol use: Yes    Comment: weekend beers   Drug use: Yes    Types: Marijuana    Comment: CBD gummies   Sexual activity: Yes    Birth control/protection: None  Other Topics Concern   Not on file  Social History Narrative   Patient is right handed and consumes 2-3 sodas daily   Social Drivers of Health   Financial Resource Strain: High Risk  (07/24/2023)   Overall Financial Resource Strain (CARDIA)    Difficulty of Paying Living Expenses: Hard  Food Insecurity: Food Insecurity Present (07/24/2023)   Hunger Vital Sign    Worried About Running Out of Food in the Last Year: Sometimes true    Ran Out of Food in the Last Year: Sometimes true  Transportation Needs: No Transportation Needs (07/24/2023)   PRAPARE - Administrator, Civil Service (Medical): No    Lack of Transportation (Non-Medical): No  Physical Activity: Insufficiently Active (07/24/2023)   Exercise Vital Sign    Days of Exercise per Week: 2 days    Minutes of Exercise per Session: 20 min  Stress: Stress Concern Present (07/24/2023)   [employer] of Occupational Health - Occupational Stress Questionnaire    Feeling of Stress : To some extent  Social Connections: Socially Isolated (07/24/2023)   Social Connection and Isolation Panel [NHANES]    Frequency of Communication with Friends and Family: More than three times a week    Frequency of Social Gatherings with Friends and Family: Never    Attends Religious Services: Never    Database administrator or Organizations: No    Attends Engineer, structural: Not on file    Marital Status: Divorced  Catering manager Violence: Not on file    Outpatient Medications Prior to Visit  Medication Sig Dispense Refill   ALPRAZolam (XANAX) 1 MG tablet Take 1 tablet (1 mg total) by mouth 3 (three) times daily as needed for sleep or anxiety. 90 tablet 2   amLODipine-olmesartan (AZOR) 10-40 MG tablet Take 1 tablet by mouth daily. 90 tablet 1   folic acid (FOLVITE) 1 MG tablet Take 1 tablet (1 mg total) by mouth daily. 30 tablet 0   OVER THE COUNTER MEDICATION Diltiazem mg unknown one bid.     sildenafil (VIAGRA) 100 MG tablet Take 1 tablet (100 mg total) by mouth as needed for erectile dysfunction. 18 tablet 1   DULoxetine (CYMBALTA) 30 MG capsule Take 1 capsule (30 mg total) by mouth daily. 90 capsule 0    gabapentin (NEURONTIN) 100 MG capsule Take 1 capsule (100 mg total) by mouth at bedtime. 30 capsule 1   gabapentin (NEURONTIN) 100 MG capsule Take 2 capsules (200 mg total) by mouth at bedtime. 60 capsule 1   No facility-administered medications prior to visit.    No Known Allergies  ROS Review of Systems  Constitutional:  Negative for chills and fever.  HENT:  Negative for congestion and sore throat.   Eyes:  Negative for pain and discharge.  Respiratory:  Negative for cough and shortness of breath.   Cardiovascular:  Negative for chest pain and palpitations.  Gastrointestinal:  Negative for diarrhea, nausea and  vomiting.  Endocrine: Negative for polydipsia and polyuria.  Genitourinary:  Negative for dysuria and hematuria.  Musculoskeletal:  Positive for back pain. Negative for neck pain and neck stiffness.  Skin:  Negative for rash.  Neurological:  Negative for dizziness and weakness.  Psychiatric/Behavioral:  Negative for agitation and behavioral problems. The patient is nervous/anxious.       Objective:    Physical Exam Vitals reviewed.  Constitutional:      General: He is not in acute distress.    Appearance: He is not diaphoretic.  HENT:     Head: Normocephalic and atraumatic.     Nose: Nose normal.     Mouth/Throat:     Mouth: Mucous membranes are moist.  Eyes:     General: No scleral icterus.    Extraocular Movements: Extraocular movements intact.  Cardiovascular:     Rate and Rhythm: Normal rate and regular rhythm.     Heart sounds: Normal heart sounds. No murmur heard. Pulmonary:     Breath sounds: Normal breath sounds. No wheezing or rales.  Musculoskeletal:     Cervical back: Neck supple. No tenderness.     Right lower leg: No edema.     Left lower leg: No edema.  Skin:    General: Skin is warm.     Findings: No rash.  Neurological:     General: No focal deficit present.     Mental Status: He is alert and oriented to person, place, and time.      Cranial Nerves: No cranial nerve deficit.     Sensory: No sensory deficit.     Motor: Weakness (B/l LE - 4/5) present.  Psychiatric:        Mood and Affect: Mood normal.        Behavior: Behavior normal.     BP 129/80 (BP Location: Left Arm, Patient Position: Sitting, Cuff Size: Normal)   Pulse 85   Ht 6' (1.829 m)   Wt 208 lb 6.4 oz (94.5 kg)   SpO2 93%   BMI 28.26 kg/m  Wt Readings from Last 3 Encounters:  07/31/23 208 lb 6.4 oz (94.5 kg)  07/01/23 209 lb 3.2 oz (94.9 kg)  06/20/23 213 lb (96.6 kg)    Lab Results  Component Value Date   TSH 0.895 02/21/2023   Lab Results  Component Value Date   WBC 10.7 07/23/2023   HGB 17.1 07/23/2023   HCT 48.3 07/23/2023   MCV 91 07/23/2023   PLT 239 07/23/2023   Lab Results  Component Value Date   NA 143 07/23/2023   K 3.8 07/23/2023   CO2 26 07/23/2023   GLUCOSE 107 (H) 07/23/2023   BUN 12 07/23/2023   CREATININE 0.91 07/23/2023   BILITOT 2.0 (H) 07/23/2023   ALKPHOS 128 (H) 07/23/2023   AST 23 07/23/2023   ALT 33 07/23/2023   PROT 6.7 07/23/2023   ALBUMIN 4.2 07/23/2023   CALCIUM 9.7 07/23/2023   ANIONGAP 9 03/28/2017   EGFR 100 07/23/2023   Lab Results  Component Value Date   CHOL 176 02/21/2023   Lab Results  Component Value Date   HDL 59 02/21/2023   Lab Results  Component Value Date   LDLCALC 96 02/21/2023   Lab Results  Component Value Date   TRIG 116 02/21/2023   Lab Results  Component Value Date   CHOLHDL 3.0 02/21/2023   Lab Results  Component Value Date   HGBA1C 5.1 02/21/2023      Assessment &  Plan:   Problem List Items Addressed This Visit       Cardiovascular and Mediastinum   Essential hypertension - Primary   BP Readings from Last 1 Encounters:  07/31/23 129/80   Well controlled with amlodipine-olmesartan 10-40 mg once daily, DC diltiazem as it was inadequate Counseled for compliance with the medications Advised DASH diet and moderate exercise/walking, at least 150  mins/week       Relevant Orders   CBC with Differential/Platelet   CMP14+EGFR     Respiratory   OSA (obstructive sleep apnea)   Untreated sleep apnea likely contributing to elevated BP and also periodic limb movement Followed by sleep specialist        Other   Restless legs syndrome   Vs periodic limb movements during sleep Better with Gabapentin 200 mg, but still has insomnia - increased dose of Gabapentin to 300 mg qHS Has tried Pramipexole without much relief      Relevant Medications   gabapentin (NEURONTIN) 300 MG capsule   GAD (generalized anxiety disorder)   Well-controlled with Cymbalta 30 mg QD and Xanax 1 mg 3 times daily Advised to keep Xanax dose to maximum 3 times daily Advised to avoid marijuana products      Relevant Medications   DULoxetine (CYMBALTA) 30 MG capsule   Erythrocytosis   Likely due to OSA Hb ~ 17 Recheck CBC - if persistently elevated Hb, may advise Phlebotomy      Relevant Orders   CBC with Differential/Platelet     Meds ordered this encounter  Medications   gabapentin (NEURONTIN) 300 MG capsule    Sig: Take 1 capsule (300 mg total) by mouth at bedtime.    Dispense:  30 capsule    Refill:  5   DULoxetine (CYMBALTA) 30 MG capsule    Sig: Take 1 capsule (30 mg total) by mouth daily.    Dispense:  90 capsule    Refill:  1    Follow-up: Return in about 4 months (around 11/28/2023) for HTN and GAD.    Anabel Halon, MD

## 2023-07-31 NOTE — Assessment & Plan Note (Addendum)
 Vs periodic limb movements during sleep Better with Gabapentin 200 mg, but still has insomnia - increased dose of Gabapentin to 300 mg qHS Has tried Pramipexole without much relief

## 2023-07-31 NOTE — Assessment & Plan Note (Signed)
 Well-controlled with Cymbalta 30 mg QD and Xanax 1 mg 3 times daily Advised to keep Xanax dose to maximum 3 times daily Advised to avoid marijuana products

## 2023-07-31 NOTE — Assessment & Plan Note (Signed)
 Untreated sleep apnea likely contributing to elevated BP and also periodic limb movement Followed by sleep specialist

## 2023-08-13 ENCOUNTER — Ambulatory Visit: Payer: Medicare HMO | Admitting: Primary Care

## 2023-08-14 ENCOUNTER — Ambulatory Visit: Payer: Medicare HMO

## 2023-08-18 ENCOUNTER — Other Ambulatory Visit: Payer: Self-pay | Admitting: Internal Medicine

## 2023-08-18 DIAGNOSIS — F411 Generalized anxiety disorder: Secondary | ICD-10-CM

## 2023-08-26 ENCOUNTER — Other Ambulatory Visit: Payer: Self-pay | Admitting: Internal Medicine

## 2023-09-19 ENCOUNTER — Ambulatory Visit: Payer: Medicare HMO | Admitting: Pulmonary Disease

## 2023-09-19 ENCOUNTER — Ambulatory Visit: Payer: Medicare HMO | Admitting: Primary Care

## 2023-09-19 ENCOUNTER — Encounter: Payer: Self-pay | Admitting: Primary Care

## 2023-09-19 ENCOUNTER — Ambulatory Visit

## 2023-09-19 VITALS — BP 125/83 | HR 65 | Temp 97.8°F | Ht 72.0 in | Wt 207.6 lb

## 2023-09-19 DIAGNOSIS — R9389 Abnormal findings on diagnostic imaging of other specified body structures: Secondary | ICD-10-CM

## 2023-09-19 DIAGNOSIS — Z720 Tobacco use: Secondary | ICD-10-CM

## 2023-09-19 DIAGNOSIS — G4734 Idiopathic sleep related nonobstructive alveolar hypoventilation: Secondary | ICD-10-CM | POA: Diagnosis not present

## 2023-09-19 DIAGNOSIS — J42 Unspecified chronic bronchitis: Secondary | ICD-10-CM | POA: Diagnosis not present

## 2023-09-19 DIAGNOSIS — R0600 Dyspnea, unspecified: Secondary | ICD-10-CM | POA: Diagnosis not present

## 2023-09-19 DIAGNOSIS — G2581 Restless legs syndrome: Secondary | ICD-10-CM | POA: Diagnosis not present

## 2023-09-19 DIAGNOSIS — Z8669 Personal history of other diseases of the nervous system and sense organs: Secondary | ICD-10-CM | POA: Diagnosis not present

## 2023-09-19 LAB — PULMONARY FUNCTION TEST
DL/VA % pred: 111 %
DL/VA: 4.86 ml/min/mmHg/L
DLCO unc % pred: 110 %
DLCO unc: 31.65 ml/min/mmHg
FEF 25-75 Post: 3.9 L/s
FEF 25-75 Pre: 3.06 L/s
FEF2575-%Change-Post: 27 %
FEF2575-%Pred-Post: 119 %
FEF2575-%Pred-Pre: 93 %
FEV1-%Change-Post: 6 %
FEV1-%Pred-Post: 100 %
FEV1-%Pred-Pre: 94 %
FEV1-Post: 3.83 L
FEV1-Pre: 3.6 L
FEV1FVC-%Change-Post: 0 %
FEV1FVC-%Pred-Pre: 100 %
FEV6-%Change-Post: 5 %
FEV6-%Pred-Post: 103 %
FEV6-%Pred-Pre: 98 %
FEV6-Post: 4.93 L
FEV6-Pre: 4.66 L
FEV6FVC-%Change-Post: 0 %
FEV6FVC-%Pred-Post: 104 %
FEV6FVC-%Pred-Pre: 103 %
FVC-%Change-Post: 5 %
FVC-%Pred-Post: 99 %
FVC-%Pred-Pre: 94 %
FVC-Post: 4.93 L
FVC-Pre: 4.67 L
Post FEV1/FVC ratio: 78 %
Post FEV6/FVC ratio: 100 %
Pre FEV1/FVC ratio: 77 %
Pre FEV6/FVC Ratio: 100 %
RV % pred: 147 %
RV: 3.13 L
TLC % pred: 113 %
TLC: 7.93 L

## 2023-09-19 NOTE — Progress Notes (Signed)
 Full pft performed today.

## 2023-09-19 NOTE — Progress Notes (Signed)
 @Patient  ID: Chad Foster, male    DOB: [birth year], 53 y.o.   MRN: [id number]  Chief Complaint  Patient presents with   Follow-up    F/U on PFT    Referring provider: Meldon Sport, MD  HPI: 53 year old male, former smoker. PMH significant for HTN, OSA, GERD, GAD, RLS.   09/19/2023 Discussed the use of AI scribe software for clinical note transcription with the patient, who gave verbal consent to proceed.  History of Present Illness   Chad Foster is a 53 year old male with moderate to severe sleep apnea who presents for follow-up on sleep disturbances.  He was initially seen in December for sleep disturbances, including bad dreams, waking up at night, and excessive daytime sleepiness. He was diagnosed with moderate to severe sleep apnea in 2015 and was previously on CPAP therapy, which he discontinued due to perceived ineffectiveness and discomfort.  In January, his gabapentin  dose was increased to 300 mg at bedtime to help with restless leg symptoms. Since starting gabapentin , he has only had one episode of bad dreams, but he continues to wake up frequently at night without a clear cause. He typically wakes up every two to four hours, sometimes due to discomfort from the rods in his back. His fiance has noticed less leg movement since starting gabapentin .  He describes his sleep pattern as lying down around 9:30 PM, waking up at 11:45 PM, and then again at 2:00 AM, 3:30 AM, and 5:00 AM, after which he stays awake. He sometimes experiences sore legs in the morning, suggesting possible movement during sleep.  He has a history of smoking but quit about 20 years ago after his father passed away from lung cancer. He was a heavy smoker, consuming about two and a half packs a day. He attributes some of his shortness of breath to being out of shape due to being on disability. No current shortness of breath or cough.      Pulmonary function testing 09/19/2023 PFT >> FVC 4.93 (99%),  FEV1 3.83 (100%), ratio 78, DLCOunc 100%   No Known Allergies  Immunization History  Administered Date(s) Administered   Zoster Recombinant(Shingrix) 06/15/2022, 08/17/2022    Past Medical History:  Diagnosis Date   Enlarged prostate    but no meds required at present   GERD (gastroesophageal reflux disease)    takes Dexilant  daily   Hypertension    takes benicar  daily   Insomnia    Weakness    bilateral legs d/t back problems    Tobacco History: Social History   Tobacco Use  Smoking Status Former   Current packs/day: 1.50   Average packs/day: 1.5 packs/day for 15.0 years (22.5 ttl pk-yrs)   Types: Cigars, Cigarettes  Smokeless Tobacco Current  Tobacco Comments   quit 16 yrs ago,smokes E-Cigs   Ready to quit: Not Answered Counseling given: Not Answered Tobacco comments: quit 16 yrs ago,smokes E-Cigs   Outpatient Medications Prior to Visit  Medication Sig Dispense Refill   ALPRAZolam  (XANAX ) 1 MG tablet Take 1 tablet (1 mg total) by mouth 3 (three) times daily as needed for sleep or anxiety. 90 tablet 2   amLODipine -olmesartan  (AZOR ) 10-40 MG tablet Take 1 tablet by mouth daily. 90 tablet 1   DULoxetine  (CYMBALTA ) 30 MG capsule Take 1 capsule (30 mg total) by mouth daily. 90 capsule 1   folic acid  (FOLVITE ) 1 MG tablet Take 1 tablet (1 mg total) by mouth daily. 30 tablet 0   gabapentin  (NEURONTIN ) 300  MG capsule Take 1 capsule (300 mg total) by mouth at bedtime. 30 capsule 5   OVER THE COUNTER MEDICATION Diltiazem  mg unknown one bid.     sildenafil  (VIAGRA ) 100 MG tablet Take 1 tablet (100 mg total) by mouth as needed for erectile dysfunction. 18 tablet 1   No facility-administered medications prior to visit.   Review of Systems  Review of Systems  Constitutional: Negative.   HENT: Negative.    Respiratory:  Positive for shortness of breath. Negative for cough, chest tightness and wheezing.   Psychiatric/Behavioral:  Positive for sleep disturbance.     Physical Exam  BP 125/83 (BP Location: Left Arm, Patient Position: Sitting, Cuff Size: Large)   Pulse 65   Temp 97.8 F (36.6 C) (Temporal)   Ht 6' (1.829 m)   Wt 207 lb 9.6 oz (94.2 kg)   SpO2 95%   BMI 28.16 kg/m  Physical Exam Constitutional:      General: He is not in acute distress.    Appearance: Normal appearance. He is not ill-appearing.  HENT:     Head: Normocephalic and atraumatic.  Cardiovascular:     Rate and Rhythm: Normal rate and regular rhythm.  Pulmonary:     Effort: Pulmonary effort is normal.     Breath sounds: No wheezing.  Musculoskeletal:        General: Normal range of motion.  Skin:    General: Skin is warm and dry.  Neurological:     General: No focal deficit present.     Mental Status: He is alert and oriented to person, place, and time. Mental status is at baseline.  Psychiatric:        Mood and Affect: Mood normal.        Behavior: Behavior normal.        Thought Content: Thought content normal.        Judgment: Judgment normal.      Lab Results:  CBC    Component Value Date/Time   WBC 10.7 07/23/2023 1105   WBC 9.8 03/28/2017 2107   RBC 5.30 07/23/2023 1105   RBC 5.10 03/28/2017 2107   HGB 17.1 07/23/2023 1105   HCT 48.3 07/23/2023 1105   PLT 239 07/23/2023 1105   MCV 91 07/23/2023 1105   MCH 32.3 07/23/2023 1105   MCH 32.9 03/28/2017 2107   MCHC 35.4 07/23/2023 1105   MCHC 35.9 03/28/2017 2107   RDW 12.0 07/23/2023 1105   LYMPHSABS 1.9 07/23/2023 1105   MONOABS 1.0 03/28/2017 2107   EOSABS 0.2 07/23/2023 1105   BASOSABS 0.1 07/23/2023 1105    BMET    Component Value Date/Time   NA 143 07/23/2023 1105   K 3.8 07/23/2023 1105   CL 101 07/23/2023 1105   CO2 26 07/23/2023 1105   GLUCOSE 107 (H) 07/23/2023 1105   GLUCOSE 107 (H) 03/28/2017 2107   BUN 12 07/23/2023 1105   CREATININE 0.91 07/23/2023 1105   CALCIUM 9.7 07/23/2023 1105   GFRNONAA >60 03/28/2017 2107   GFRAA >60 03/28/2017 2107    BNP No results  found for: "BNP"  ProBNP No results found for: "PROBNP"  Imaging: No results found.   Assessment & Plan:   1. Hx of sleep apnea (Primary) - Home sleep test; Future  2. Chronic bronchitis, unspecified chronic bronchitis type (HCC) - DG Chest 2 View; Future  3. Restless legs syndrome  4. Nocturnal hypoxia  Assessment and Plan    Sleep Apnea Moderate to severe sleep apnea  diagnosed in 2015. Recent in-lab sleep study inconclusive. Symptoms suggestive of sleep apnea. Home sleep study planned for reassessment. CPAP with nasal mask to be retried if confirmed. Inspire implant considered if CPAP intolerable. - Order home sleep study. - Retry CPAP with nasal mask if sleep apnea confirmed. - Consider ENT referral for Inspire implant evaluation if CPAP intolerable. - Discuss Inspire implant procedure.  Nocturnal Hypoxia Previous study indicated nocturnal hypoxia. Not on nocturnal oxygen, suspect related to undiagnosed OSA. PFTs were normal with evidence of obstructive lung disease. Reassessment needed due to insufficient sleep time in prior study. - Reassess nocturnal hypoxia with home sleep study.  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 Reduced leg movement and fewer bad dreams reported. - Continue gabapentin  300 mg at bedtime.  Chronic Bronchitis Chest x-ray showed lung markings. Normal lung function and diffusion capacity. No obstructive lung disease or COPD. Smoking history noted. - Repeat chest x-ray in one year.       Antonio Baumgarten, NP 09/19/2023

## 2023-09-19 NOTE — Patient Instructions (Signed)
-  SLEEP APNEA: Sleep apnea is a condition where your breathing repeatedly stops and starts during sleep. We will conduct a home sleep study to reassess your condition. If sleep apnea is confirmed, we will retry CPAP therapy with a nasal mask. If CPAP is intolerable, we may consider an Inspire implant, which is a device that helps keep your airway open during sleep.  -NOCTURNAL HYPOXIA: Nocturnal hypoxia is a condition where your oxygen levels drop during sleep. We will reassess this with the home sleep study to ensure you are getting enough oxygen at night.  -PERIODIC LIMB MOVEMENT DISORDER AND REM SLEEP BEHAVIOR DISORDER: These are conditions where you experience involuntary leg movements and abnormal behaviors during sleep. Your symptoms have improved with gabapentin, so we will continue your current dose of 300 mg at bedtime.  -CHRONIC BRONCHITIS: Chronic bronchitis is a long-term inflammation of the airways in the lungs. Your chest x-ray showed some lung markings, but your lung function is normal. We will repeat the chest x-ray in one year to monitor your condition.  INSTRUCTIONS:  Please complete the home sleep study as planned. If sleep apnea is confirmed, retry CPAP therapy with a nasal mask. If you find CPAP intolerable, we will discuss the possibility of an Inspire implant. Continue taking gabapentin 300 mg at bedtime for your leg movements and sleep behavior. We will repeat your chest x-ray in one year to monitor your lung health.   Follow-up 3 months with Beth NP/ CXR 2-3 days prior please

## 2023-09-20 ENCOUNTER — Ambulatory Visit (INDEPENDENT_AMBULATORY_CARE_PROVIDER_SITE_OTHER)

## 2023-09-20 VITALS — BP 125/80 | Ht 72.0 in | Wt 207.0 lb

## 2023-09-20 DIAGNOSIS — Z0000 Encounter for general adult medical examination without abnormal findings: Secondary | ICD-10-CM | POA: Diagnosis not present

## 2023-09-20 NOTE — Progress Notes (Addendum)
Because this visit was a virtual/telehealth visit,  certain criteria was not obtained, such a blood pressure, CBG if applicable, and timed get up and go. Any medications not marked as "taking" were not mentioned during the medication reconciliation part of the visit. Any vitals not documented were not able to be obtained due to this being a telehealth visit or patient was unable to self-report a recent blood pressure reading due to a lack of equipment at home via telehealth. Vitals that have been documented are verbally provided by the patient.   Subjective:   Chad Foster is a 53 y.o. who presents for a Medicare Wellness preventive visit.  Visit Complete: Virtual I connected with  Chad Foster on 09/20/23 by a audio enabled telemedicine application and verified that I am speaking with the correct person using two identifiers.  Patient Location: Home  Provider Location: Home Office  I discussed the limitations of evaluation and management by telemedicine. The patient expressed understanding and agreed to proceed.  Vital Signs: Because this visit was a virtual/telehealth visit, some criteria may be missing or patient reported. Any vitals not documented were not able to be obtained and vitals that have been documented are patient reported.  VideoDeclined- This patient declined Librarian, academic. Therefore the visit was completed with audio only.  Persons Participating in Visit: Patient.  AWV Questionnaire: Yes: Patient Medicare AWV questionnaire was completed by the patient on 09/18/2023; I have confirmed that all information answered by patient is correct and no changes since this date.  Cardiac Risk Factors include: hypertension;male gender;smoking/ tobacco exposure     Objective:    Today's Vitals   09/18/23 1235 09/20/23 0820  BP:  125/80  Weight:  207 lb (93.9 kg)  Height:  6' (1.829 m)  PainSc: 3     Body mass index is 28.07 kg/m.      09/20/2023    8:07 AM 03/12/2023    8:01 AM 03/08/2023    2:21 PM 03/28/2017    8:34 PM 02/23/2014    8:34 AM 12/12/2012    8:20 AM 06/23/2012    9:21 AM  Advanced Directives  Does Patient Have a Medical Advance Directive? No No No No No Patient does not have advance directive;Patient would not like information Patient does not have advance directive;Patient would not like information  Would patient like information on creating a medical advance directive? No - Patient declined  No - Patient declined No - Patient declined No - patient declined information    Pre-existing out of facility DNR order (yellow form or pink MOST form)      No No    Current Medications (verified) Outpatient Encounter Medications as of 09/20/2023  Medication Sig   ALPRAZolam  (XANAX ) 1 MG tablet Take 1 tablet (1 mg total) by mouth 3 (three) times daily as needed for sleep or anxiety.   amLODipine -olmesartan  (AZOR ) 10-40 MG tablet Take 1 tablet by mouth daily.   DULoxetine  (CYMBALTA ) 30 MG capsule Take 1 capsule (30 mg total) by mouth daily.   folic acid  (FOLVITE ) 1 MG tablet Take 1 tablet (1 mg total) by mouth daily.   gabapentin  (NEURONTIN ) 300 MG capsule Take 1 capsule (300 mg total) by mouth at bedtime.   OVER THE COUNTER MEDICATION Diltiazem  mg unknown one bid.   sildenafil  (VIAGRA ) 100 MG tablet Take 1 tablet (100 mg total) by mouth as needed for erectile dysfunction.   No facility-administered encounter medications on file as of 09/20/2023.  Allergies (verified) Patient has no known allergies.   History: Past Medical History:  Diagnosis Date   Anxiety 1996   Depression 1996   Enlarged prostate    but no meds required at present   GERD (gastroesophageal reflux disease)    takes Dexilant  daily   Hypertension    takes benicar  daily   Insomnia    Neuromuscular disorder (HCC) 1997   Sleep apnea 1996   Weakness    bilateral legs d/t back problems   Past Surgical History:  Procedure Laterality Date    BACK SURGERY     2010, Dr. Ellery Guthrie, L5-S1   CHOLECYSTECTOMY     COLONOSCOPY WITH PROPOFOL  N/A 03/12/2023   Procedure: COLONOSCOPY WITH PROPOFOL ;  Surgeon: Urban Garden, MD;  [facility];  Service: Gastroenterology;  Laterality: N/A;  9:30;asa 1   FINGER FRACTURE SURGERY Right    reattatched   HEMOSTASIS CLIP PLACEMENT  03/12/2023   Procedure: HEMOSTASIS CLIP PLACEMENT;  Surgeon: Urban Garden, MD;  Location: [facility];  Service: Gastroenterology;;   MASS EXCISION  06/23/2012   Procedure: EXCISION MASS;  Surgeon: Beau Bound, MD;  Location: [facility];  Service: General;  Laterality: N/A;  Excision Chest Wall Mass   NASAL SINUS SURGERY     POLYPECTOMY  03/12/2023   Procedure: POLYPECTOMY;  Surgeon: Urban Garden, MD;  Location: [facility];  Service: Gastroenterology;;   SPINE SURGERY  1997   SUBMUCOSAL LIFTING INJECTION  03/12/2023   Procedure: SUBMUCOSAL LIFTING INJECTION;  Surgeon: Urban Garden, MD;  Location: [facility];  Service: Gastroenterology;;   Family History  Problem Relation Age of Onset   Cancer - Lung Father    Alcohol abuse Father    Anxiety disorder Mother    Depression Mother    Diabetes Mother    Kidney disease Mother    Stroke Mother    Cancer Brother    COPD Brother    Depression Brother    Stroke Brother    Social History   Socioeconomic History   Marital status: Significant Other    Spouse name: Joyice Nodal   Number of children: 2   Years of education: 12   Highest education level: Some college, no degree  Occupational History    Comment: not employed  Tobacco Use   Smoking status: Former    Current packs/day: 1.50    Average packs/day: 1.6 packs/day for 18.8 years (30.1 ttl pk-yrs)    Types: Cigarettes, Cigars   Smokeless tobacco: Current   Tobacco comments:    quit 16 yrs ago,smokes E-Cigs  Vaping Use   Vaping status: Former  Substance and Sexual Activity   Alcohol use: Yes     Alcohol/week: 3.0 standard drinks of alcohol    Types: 3 Shots of liquor per week    Comment: weekend beers   Drug use: Yes    Frequency: 3.0 times per week    Types: Marijuana    Comment: CBD gummies   Sexual activity: Yes    Birth control/protection: None  Other Topics Concern   Not on file  Social History Narrative   Patient is right handed and consumes 2-3 sodas daily   Social Drivers of Health   Financial Resource Strain: High Risk (09/20/2023)   Overall Financial Resource Strain (CARDIA)    Difficulty of Paying Living Expenses: Hard  Food Insecurity: No Food Insecurity (09/20/2023)   Hunger Vital Sign    Worried About Running Out of Food  in the Last Year: Never true    Ran Out of Food in the Last Year: Never true  Recent Concern: Food Insecurity - Food Insecurity Present (07/24/2023)   Hunger Vital Sign    Worried About Running Out of Food in the Last Year: Sometimes true    Ran Out of Food in the Last Year: Sometimes true  Transportation Needs: No Transportation Needs (09/20/2023)   PRAPARE - Administrator, Civil Service (Medical): No    Lack of Transportation (Non-Medical): No  Physical Activity: Sufficiently Active (09/20/2023)   Exercise Vital Sign    Days of Exercise per Week: 7 days    Minutes of Exercise per Session: 30 min  Recent Concern: Physical Activity - Insufficiently Active (07/24/2023)   Exercise Vital Sign    Days of Exercise per Week: 2 days    Minutes of Exercise per Session: 20 min  Stress: No Stress Concern Present (09/20/2023)   [employer] of Occupational Health - Occupational Stress Questionnaire    Feeling of Stress : Not at all  Recent Concern: Stress - Stress Concern Present (07/24/2023)   [employer] of Occupational Health - Occupational Stress Questionnaire    Feeling of Stress : To some extent  Social Connections: Socially Isolated (09/20/2023)   Social Connection and Isolation Panel [NHANES]    Frequency of  Communication with Friends and Family: More than three times a week    Frequency of Social Gatherings with Friends and Family: Never    Attends Religious Services: Never    Database administrator or Organizations: No    Attends Engineer, structural: Never    Marital Status: Divorced    Tobacco Counseling Ready to quit: No Counseling given: Yes Tobacco comments: quit 16 yrs ago,smokes E-Cigs    Clinical Intake:  Pre-visit preparation completed: Yes  Pain : 0-10 Pain Score: 3  Pain Type: Chronic pain Pain Location: Back Pain Orientation: Lower Pain Descriptors / Indicators: Aching Pain Onset: More than a month ago Pain Frequency: Constant     BMI - recorded: 28.07 Nutritional Status: BMI 25 -29 Overweight Nutritional Risks: None Diabetes: No  Lab Results  Component Value Date   HGBA1C 5.1 02/21/2023     How often do you need to have someone help you when you read instructions, pamphlets, or other written materials from your doctor or pharmacy?: 1 - Never  Interpreter Needed?: No  Information entered by :: Sally Crazier CMA   Activities of Daily Living     09/18/2023   12:35 PM 03/08/2023    2:24 PM  In your present state of health, do you have any difficulty performing the following activities:  Hearing? 0   Vision? 1   Difficulty concentrating or making decisions? 1   Walking or climbing stairs? 0   Dressing or bathing? 0   Doing errands, shopping? 0 0  Preparing Food and eating ? N   Using the Toilet? N   In the past six months, have you accidently leaked urine? N   Do you have problems with loss of bowel control? N   Managing your Medications? N   Managing your Finances? N   Housekeeping or managing your Housekeeping? N     Patient Care Team: Meldon Sport, MD as PCP - General (Internal Medicine)  Indicate any recent Medical Services you may have received from other than [facility] providers in the past year (date may be approximate).      Assessment:  This is a routine wellness examination for Chad Foster.  Hearing/Vision screen Hearing Screening - Comments:: Patient denies any hearing difficulties.    Vision Screening - Comments:: Wears rx glasses - up to date with routine eye exams  Patient sees Dr. Lucendia Rusk w/ [facility].     Goals Addressed             This Visit's Progress    Patient Stated       I want to go on a cruise       Depression Screen     09/20/2023    8:12 AM 04/29/2023    1:24 PM 02/21/2023    1:09 PM  PHQ 2/9 Scores  PHQ - 2 Score 1 2 2   PHQ- 9 Score 7 7 6     Fall Risk     09/19/2023   10:54 AM 09/18/2023   12:35 PM 07/01/2023    1:13 PM 04/29/2023    1:24 PM 02/21/2023    1:09 PM  Fall Risk   Falls in the past year? 0 0 0 0 0  Number falls in past yr:  0 0 0 0  Injury with Fall?  0 0 0 0  Risk for fall due to :  No Fall Risks  No Fall Risks   Follow up  Falls prevention discussed;Falls evaluation completed;Education provided  Falls evaluation completed     MEDICARE RISK AT HOME:  Medicare Risk at Home Any stairs in or around the home?: (Patient-Rptd) No If so, are there any without handrails?: (Patient-Rptd) No Home free of loose throw rugs in walkways, pet beds, electrical cords, etc?: (Patient-Rptd) Yes Adequate lighting in your home to reduce risk of falls?: (Patient-Rptd) Yes Life alert?: (Patient-Rptd) No Use of a cane, walker or w/c?: (Patient-Rptd) Yes Grab bars in the bathroom?: (Patient-Rptd) Yes Shower chair or bench in shower?: (Patient-Rptd) No Elevated toilet seat or a handicapped toilet?: (Patient-Rptd) No  TIMED UP AND GO:  Was the test performed?  No  Cognitive Function: Declined: Patient declined cognitive screening, but was able to answer questions in an accurate and timely manner. No cognitive impairments observed.        09/20/2023    8:11 AM  6CIT Screen  What Year? 0 points  What month? 0 points  What time? 0 points   Count back from 20 0 points  Months in reverse 0 points  Repeat phrase 0 points  Total Score 0 points    Immunizations Immunization History  Administered Date(s) Administered   Zoster Recombinant(Shingrix) 06/15/2022, 08/17/2022    Screening Tests Health Maintenance  Topic Date Due   COVID-19 Vaccine (1) Never done   DTaP/Tdap/Td (1 - Tdap) Never done   Pneumococcal Vaccine 57-21 Years old (1 of 2 - PCV) Never done   INFLUENZA VACCINE  01/03/2024   Medicare Annual Wellness (AWV)  09/19/2024   Colonoscopy  03/11/2026   Hepatitis C Screening  Completed   HIV Screening  Completed   Zoster Vaccines- Shingrix  Completed   HPV VACCINES  Aged Out   Meningococcal B Vaccine  Aged Out    Health Maintenance  Health Maintenance Due  Topic Date Due   COVID-19 Vaccine (1) Never done   DTaP/Tdap/Td (1 - Tdap) Never done   Pneumococcal Vaccine 9-52 Years old (1 of 2 - PCV) Never done   Health Maintenance Items Addressed: Patient advised of recommended vaccines and where to obtain those vaccines with verbal understanding  Additional  Screening:  Vision Screening: Recommended annual ophthalmology exams for early detection of glaucoma and other disorders of the eye.  Dental Screening: Recommended annual dental exams for proper oral hygiene  Community Resource Referral / Chronic Care Management: CRR required this visit?  No   CCM required this visit?  No     Plan:     I have personally reviewed and noted the following in the patient's chart:   Medical and social history Use of alcohol, tobacco or illicit drugs  Current medications and supplements including opioid prescriptions. Patient is not currently taking opioid prescriptions. Functional ability and status Nutritional status Physical activity Advanced directives List of other physicians Hospitalizations, surgeries, and ER visits in previous 12 months Vitals Screenings to include cognitive, depression, and  falls Referrals and appointments  In addition, I have reviewed and discussed with patient certain preventive protocols, quality metrics, and best practice recommendations. A written personalized care plan for preventive services as well as general preventive health recommendations were provided to patient.     Marian Meneely Errin Whitelaw, CMA   09/20/2023   After Visit Summary: (MyChart) Due to this being a telephonic visit, the after visit summary with patients personalized plan was offered to patient via MyChart   Notes: Nothing significant to report at this time.

## 2023-09-20 NOTE — Patient Instructions (Signed)
 [name] , Thank you for taking time to come for your Medicare Wellness Visit. I appreciate your ongoing commitment to your health goals. Please review the following plan we discussed and let me know if I can assist you in the future.   Referrals/Orders/Follow-Ups/Clinician Recommendations:  Next Medicare AWV: September 21, 2024 at 1:10 pm VIDEO VISIT  You are due for the vaccines checked below. You may have these done at your preferred pharmacy. Please have them fax the office proof of the vaccines so that we can update your chart.   []  Flu (due annually)  Recommended this fall either at PCP office or through your local pharmacy. The flu season starts August 1 of each year.   []  Shingrix (Shingles vaccine): CDC recommends 2 doses of Shingrix separated by 2-6 months for aged 45 years and older:  [x]  Pneumonia Vaccines: Recommended for adults 65 years or older  [x]  TDAP (Tetanus) Vaccine every 10 years:Recommended every 10 years; Please call your insurance company to determine your out of pocket expense. You also receive this vaccine at your local pharmacy or Health Dept.  [x]  Covid-19: Available now at any [facility] pharmacy (see info below)  You may also get your vaccines at any [facility] (locations listed below.) Vaccine hours are Monday - Friday 9:00 - 4:00. No appointments are required. Most insurances are accepted including Medicaid. Anyone can use the community pharmacies, and people are not required to have a [facility] provider.  Community Pharmacy Locations offering vaccines:   [facility]   [facility]  10 vaccines are offered at the [facility]: Covid, flu, Tdap, shingles, RSV, pneumonia, meningococcal, hepatitis A, hepatitis B, and HPV.     This is a list of the screening recommended for you and due dates:  Health Maintenance  Topic Date Due    COVID-19 Vaccine (1) Never done   DTaP/Tdap/Td vaccine (1 - Tdap) Never done   Pneumococcal Vaccination (1 of 2 - PCV) Never done   Flu Shot  01/03/2024   Medicare Annual Wellness Visit  09/19/2024   Colon Cancer Screening  03/11/2026   Hepatitis C Screening  Completed   HIV Screening  Completed   Zoster (Shingles) Vaccine  Completed   HPV Vaccine  Aged Out   Meningitis B Vaccine  Aged Out    Advanced directives: (Declined) Advance directive discussed with you today. Even though you declined this today, please call our office should you change your mind, and we can give you the proper paperwork for you to fill out. Advance Care Planning is important because it:  [x]  Makes sure you receive the medical care that is consistent with your values, goals, and preferences  [x]  It provides guidance to your family and loved ones and it also reduces their decisional burden about whether or not they are making the right decisions based on what you want done  Follow the link provided in your after visit summary or read over the paperwork we have mailed to you to help you started getting your Advance Directives in place. If you need assistance in completing these, please reach out to us  so that we can help you!   Next Medicare Annual Wellness Visit scheduled for next year: yes  Understanding Your Risk for Falls Millions of people have serious injuries from falls each year. It is important to understand your risk of falling.  Talk with your health care provider about your risk and what you can do to lower it. If you do have a serious fall, make sure to tell your provider. Falling once raises your risk of falling again. How can falls affect me? Serious injuries from falls are common. These include: Broken bones, such as hip fractures. Head injuries, such as traumatic brain injuries (TBI) or concussions. A fear of falling can cause you to avoid activities and stay at home. This can make your muscles  weaker and raise your risk for a fall. What can increase my risk? There are a number of risk factors that increase your risk for falling. The more risk factors you have, the higher your risk of falling. Serious injuries from a fall happen most often to people who are older than 55 years old. Teenagers and young adults ages 56-29 are also at higher risk. Common risk factors include: Weakness in the lower body. Being generally weak or confused due to long-term (chronic) illness. Dizziness or balance problems. Poor vision. Medicines that cause dizziness or drowsiness. These may include: Medicines for your blood pressure, heart, anxiety, insomnia, or swelling (edema). Pain medicines. Muscle relaxants. Other risk factors include: Drinking alcohol. Having had a fall in the past. Having foot pain or wearing improper footwear. Working at a dangerous job. Having any of the following in your home: Tripping hazards, such as floor clutter or loose rugs. Poor lighting. Pets. Having dementia or memory loss. What actions can I take to lower my risk of falling?     Physical activity Stay physically fit. Do strength and balance exercises. Consider taking a regular class to build strength and balance. Yoga and tai chi are good options. Vision Have your eyes checked every year and your prescription for glasses or contacts updated as needed. Shoes and walking aids Wear non-skid shoes. Wear shoes that have rubber soles and low heels. Do not wear high heels. Do not walk around the house in socks or slippers. Use a cane or walker as told by your provider. Home safety Attach secure railings on both sides of your stairs. Install grab bars for your bathtub, shower, and toilet. Use a non-skid mat in your bathtub or shower. Attach bath mats securely with double-sided, non-slip rug tape. Use good lighting in all rooms. Keep a flashlight near your bed. Make sure there is a clear path from your bed to the  bathroom. Use night-lights. Do not use throw rugs. Make sure all carpeting is taped or tacked down securely. Remove all clutter from walkways and stairways, including extension cords. Repair uneven or broken steps and floors. Avoid walking on icy or slippery surfaces. Walk on the grass instead of on icy or slick sidewalks. Use ice melter to get rid of ice on walkways in the winter. Use a cordless phone. Questions to ask your health care provider Can you help me check my risk for a fall? Do any of my medicines make me more likely to fall? Should I take a vitamin D  supplement? What exercises can I do to improve my strength and balance? Should I make an appointment to have my vision checked? Do I need a bone density test to check for weak bones (osteoporosis)? Would it help to use a cane or a walker? Where to find more information Centers for Disease Control and Prevention, STEADI: tonerpromos.no Community-Based Fall Prevention Programs: tonerpromos.no [employer] on Aging: baseringtones.pl Contact a health care provider if: You fall at home. You are afraid  of falling at home. You feel weak, drowsy, or dizzy. This information is not intended to replace advice given to you by your health care provider. Make sure you discuss any questions you have with your health care provider. Document Revised: 01/22/2022 Document Reviewed: 01/22/2022 Elsevier Patient Education  2024 [employer].

## 2023-09-28 ENCOUNTER — Other Ambulatory Visit: Payer: Self-pay | Admitting: Internal Medicine

## 2023-10-09 ENCOUNTER — Encounter

## 2023-10-09 ENCOUNTER — Ambulatory Visit: Payer: Medicare HMO

## 2023-10-09 DIAGNOSIS — Z8669 Personal history of other diseases of the nervous system and sense organs: Secondary | ICD-10-CM

## 2023-10-09 DIAGNOSIS — G4730 Sleep apnea, unspecified: Secondary | ICD-10-CM | POA: Diagnosis not present

## 2023-10-23 ENCOUNTER — Other Ambulatory Visit: Payer: Self-pay | Admitting: Internal Medicine

## 2023-10-23 ENCOUNTER — Encounter: Payer: Self-pay | Admitting: Internal Medicine

## 2023-10-23 DIAGNOSIS — G4733 Obstructive sleep apnea (adult) (pediatric): Secondary | ICD-10-CM | POA: Diagnosis not present

## 2023-10-23 DIAGNOSIS — N529 Male erectile dysfunction, unspecified: Secondary | ICD-10-CM

## 2023-10-23 MED ORDER — SILDENAFIL CITRATE 100 MG PO TABS: 100.0000 mg | ORAL_TABLET | ORAL | 1 refills | Status: AC | PRN

## 2023-10-23 MED ORDER — SILDENAFIL CITRATE 100 MG PO TABS: 100.0000 mg | ORAL_TABLET | ORAL | 1 refills | Status: DC | PRN

## 2023-11-04 ENCOUNTER — Other Ambulatory Visit: Payer: Self-pay

## 2023-11-04 MED ORDER — FOLIC ACID 1 MG PO TABS: 1.0000 mg | ORAL_TABLET | Freq: Every day | ORAL | 0 refills | Status: DC

## 2023-11-12 ENCOUNTER — Telehealth: Payer: Self-pay | Admitting: Internal Medicine

## 2023-11-12 NOTE — Telephone Encounter (Signed)
 Copied from CRM [id number]. Topic: Appointments - Scheduling Inquiry for Clinic >> Nov 12, 2023  1:06 PM Donald Frost wrote: Chad Foster would like for his brother Chad Foster to be seen by his provider Dr Lydia Sams. He knows Dr Lydia Sams is not seeing New patients but he wants to know if Dr Lydia Sams would make an exception to see his brother. He is very concerned and states he just doesn't feel like his brother is getting the best treatment possible. He has had a stroke and heart attack and [name] is concerned with his latest blood work and doesn't feel like its been addressed. He was a patient at [facility] and they are closing the office down and so he feels like his brother would be best coming to [facility] where he is going. Please assist further by calling Susana Enter after his provider has been able to respond.

## 2023-11-13 ENCOUNTER — Other Ambulatory Visit: Payer: Self-pay | Admitting: Internal Medicine

## 2023-11-13 ENCOUNTER — Encounter: Payer: Self-pay | Admitting: Internal Medicine

## 2023-11-13 DIAGNOSIS — F411 Generalized anxiety disorder: Secondary | ICD-10-CM

## 2023-11-13 MED ORDER — ALPRAZOLAM 1 MG PO TABS
1.0000 mg | ORAL_TABLET | Freq: Three times a day (TID) | ORAL | 2 refills | Status: DC | PRN
Start: 2023-11-13 — End: 2024-02-21

## 2023-11-23 ENCOUNTER — Other Ambulatory Visit: Payer: Self-pay | Admitting: Internal Medicine

## 2023-11-23 DIAGNOSIS — I10 Essential (primary) hypertension: Secondary | ICD-10-CM

## 2023-11-28 ENCOUNTER — Encounter: Payer: Self-pay | Admitting: Internal Medicine

## 2023-11-28 ENCOUNTER — Ambulatory Visit (INDEPENDENT_AMBULATORY_CARE_PROVIDER_SITE_OTHER): Payer: Medicare HMO | Admitting: Internal Medicine

## 2023-11-28 VITALS — BP 126/80 | HR 81 | Ht 72.0 in | Wt 205.0 lb

## 2023-11-28 DIAGNOSIS — M48062 Spinal stenosis, lumbar region with neurogenic claudication: Secondary | ICD-10-CM | POA: Diagnosis not present

## 2023-11-28 DIAGNOSIS — F411 Generalized anxiety disorder: Secondary | ICD-10-CM | POA: Diagnosis not present

## 2023-11-28 DIAGNOSIS — G4733 Obstructive sleep apnea (adult) (pediatric): Secondary | ICD-10-CM

## 2023-11-28 DIAGNOSIS — I10 Essential (primary) hypertension: Secondary | ICD-10-CM

## 2023-11-28 DIAGNOSIS — D751 Secondary polycythemia: Secondary | ICD-10-CM | POA: Diagnosis not present

## 2023-11-28 NOTE — Assessment & Plan Note (Signed)
 BP Readings from Last 1 Encounters:  11/28/23 126/80   Well controlled with amlodipine -olmesartan  10-40 mg once daily Counseled for compliance with the medications Advised DASH diet and moderate exercise/walking, at least 150 mins/week

## 2023-11-28 NOTE — Assessment & Plan Note (Addendum)
 Well-controlled with Cymbalta  30 mg QD and Xanax  1 mg 3 times daily Advised to keep Xanax  dose to maximum 3 times daily PDMP reviewed Advised to avoid marijuana products

## 2023-11-28 NOTE — Progress Notes (Signed)
 Established Patient Office Visit  Subjective:  Patient ID: Chad Foster, male    DOB: 06/11/1970  Age: 53 y.o. MRN: [id number]  CC:  Chief Complaint  Patient presents with   Medical Management of Chronic Issues    4 months f/u     HPI Chad Foster is a 53 y.o. male with past medical history of HTN, OSA, GAD and lumbar spinal stenosis who presents for f/u of his chronic medical conditions.  HTN: His blood pressure was wnl today.  He currently takes Azor  10-40 mg once daily. Denies any headache, dizziness, chest pain, dyspnea or palpitations.  OSA: He has history of sleep apnea. Last sleep study showed mild OSA with significant oxygen desaturation, followed by Pulmonology.  He also reports periodic limb movements at nighttime and has felt better with Gabapentin . He has tried pramipexole  without much relief.  GAD: He takes Xanax  1 mg 3 times daily now and still takes Cymbalta  30 mg QD.  Denies anhedonia, SI or HI currently. His sleep has remained the same since decreasing Xanax  dose at bedtime to 1 mg from 2 mg.   Lumbar foraminal stenosis: Reports lumbar spine surgery x 2.  He has chronic low back pain, but is manageable currently.  He has intermittent numbness of the feet as well.  Denies saddle anesthesia, urinary or stool incontinence. He takes THC gummies for pain relief, prefers to avoid opioid medicines.  Past Medical History:  Diagnosis Date   Anxiety 1996   Depression 1996   Enlarged prostate    but no meds required at present   GERD (gastroesophageal reflux disease)    takes Dexilant  daily   Hypertension    takes benicar  daily   Insomnia    Neuromuscular disorder (HCC) 1997   Sleep apnea 1996   Weakness    bilateral legs d/t back problems    Past Surgical History:  Procedure Laterality Date   BACK SURGERY     2010, Dr. Colon, L5-S1   CHOLECYSTECTOMY     COLONOSCOPY WITH PROPOFOL  N/A 03/12/2023   Procedure: COLONOSCOPY WITH PROPOFOL ;  Surgeon: Eartha Angelia Sieving, MD;  Location: [facility];  Service: Gastroenterology;  Laterality: N/A;  9:30;asa 1   FINGER FRACTURE SURGERY Right    reattatched   HEMOSTASIS CLIP PLACEMENT  03/12/2023   Procedure: HEMOSTASIS CLIP PLACEMENT;  Surgeon: Eartha Angelia Sieving, MD;  Location: [facility];  Service: Gastroenterology;;   MASS EXCISION  06/23/2012   Procedure: EXCISION MASS;  Surgeon: Oneil DELENA Budge, MD;  Location: [facility];  Service: General;  Laterality: N/A;  Excision Chest Wall Mass   NASAL SINUS SURGERY     POLYPECTOMY  03/12/2023   Procedure: POLYPECTOMY;  Surgeon: Eartha Angelia Sieving, MD;  Location: [facility];  Service: Gastroenterology;;   SPINE SURGERY  1997   SUBMUCOSAL LIFTING INJECTION  03/12/2023   Procedure: SUBMUCOSAL LIFTING INJECTION;  Surgeon: Eartha Angelia Sieving, MD;  Location: [facility];  Service: Gastroenterology;;    Family History  Problem Relation Age of Onset   Cancer - Lung Father    Alcohol abuse Father    Anxiety disorder Mother    Depression Mother    Diabetes Mother    Kidney disease Mother    Stroke Mother    Cancer Brother    COPD Brother    Depression Brother    Stroke Brother     Social History   Socioeconomic History   Marital status: Significant Other  Spouse name: Chad Foster   Number of children: 2   Years of education: 93   Highest education level: 12th grade  Occupational History    Comment: not employed  Tobacco Use   Smoking status: Former    Current packs/day: 1.50    Average packs/day: 1.6 packs/day for 18.8 years (30.1 ttl pk-yrs)    Types: Cigarettes, Cigars   Smokeless tobacco: Current   Tobacco comments:    quit 16 yrs ago, THC gummies  Vaping Use   Vaping status: Former  Substance and Sexual Activity   Alcohol use: Yes    Alcohol/week: 3.0 standard drinks of alcohol    Types: 3 Shots of liquor per week    Comment: weekend beers   Drug use: Not Currently    Frequency: 3.0 times per week     Types: Other-see comments    Comment: CBD gummies   Sexual activity: Yes    Birth control/protection: None  Other Topics Concern   Not on file  Social History Narrative   Patient is right handed and consumes 2-3 sodas daily   Social Drivers of Health   Financial Resource Strain: Medium Risk (11/21/2023)   Overall Financial Resource Strain (CARDIA)    Difficulty of Paying Living Expenses: Somewhat hard  Food Insecurity: Food Insecurity Present (11/21/2023)   Hunger Vital Sign    Worried About Running Out of Food in the Last Year: Sometimes true    Ran Out of Food in the Last Year: Sometimes true  Transportation Needs: No Transportation Needs (11/21/2023)   PRAPARE - Administrator, Civil Service (Medical): No    Lack of Transportation (Non-Medical): No  Physical Activity: Insufficiently Active (11/21/2023)   Exercise Vital Sign    Days of Exercise per Week: 1 day    Minutes of Exercise per Session: 20 min  Stress: Stress Concern Present (11/21/2023)   [employer] of Occupational Health - Occupational Stress Questionnaire    Feeling of Stress: To some extent  Social Connections: Socially Isolated (11/21/2023)   Social Connection and Isolation Panel    Frequency of Communication with Friends and Family: Once a week    Frequency of Social Gatherings with Friends and Family: Never    Attends Religious Services: Never    Database administrator or Organizations: No    Attends Engineer, structural: Not on file    Marital Status: Divorced  Intimate Partner Violence: Not At Risk (09/20/2023)   Humiliation, Afraid, Rape, and Kick questionnaire    Fear of Current or Ex-Partner: No    Emotionally Abused: No    Physically Abused: No    Sexually Abused: No    Outpatient Medications Prior to Visit  Medication Sig Dispense Refill   ALPRAZolam  (XANAX ) 1 MG tablet Take 1 tablet (1 mg total) by mouth 3 (three) times daily as needed for sleep or anxiety. 90 tablet 2    amLODipine -olmesartan  (AZOR ) 10-40 MG tablet TAKE ONE TABLET BY MOUTH DAILY 90 tablet 1   DULoxetine  (CYMBALTA ) 30 MG capsule Take 1 capsule (30 mg total) by mouth daily. 90 capsule 1   folic acid  (FOLVITE ) 1 MG tablet Take 1 tablet (1 mg total) by mouth daily. 30 tablet 0   gabapentin  (NEURONTIN ) 300 MG capsule Take 1 capsule (300 mg total) by mouth at bedtime. 30 capsule 5   OVER THE COUNTER MEDICATION Diltiazem  mg unknown one bid.     sildenafil  (VIAGRA ) 100 MG tablet Take 1 tablet (100 mg  total) by mouth as needed for erectile dysfunction. 30 tablet 1   No facility-administered medications prior to visit.    No Known Allergies  ROS Review of Systems  Constitutional:  Negative for chills and fever.  HENT:  Negative for congestion and sore throat.   Eyes:  Negative for pain and discharge.  Respiratory:  Negative for cough and shortness of breath.   Cardiovascular:  Negative for chest pain and palpitations.  Gastrointestinal:  Negative for diarrhea, nausea and vomiting.  Endocrine: Negative for polydipsia and polyuria.  Genitourinary:  Negative for dysuria and hematuria.  Musculoskeletal:  Positive for back pain. Negative for neck pain and neck stiffness.  Skin:  Negative for rash.  Neurological:  Negative for dizziness and weakness.  Psychiatric/Behavioral:  Negative for agitation and behavioral problems. The patient is nervous/anxious.       Objective:    Physical Exam Vitals reviewed.  Constitutional:      General: He is not in acute distress.    Appearance: He is not diaphoretic.  HENT:     Head: Normocephalic and atraumatic.     Nose: Nose normal.     Mouth/Throat:     Mouth: Mucous membranes are moist.   Eyes:     General: No scleral icterus.    Extraocular Movements: Extraocular movements intact.    Cardiovascular:     Rate and Rhythm: Normal rate and regular rhythm.     Heart sounds: Normal heart sounds. No murmur heard. Pulmonary:     Breath sounds: Normal  breath sounds. No wheezing or rales.   Musculoskeletal:     Cervical back: Neck supple. No tenderness.     Right lower leg: No edema.     Left lower leg: No edema.   Skin:    General: Skin is warm.     Findings: No rash.   Neurological:     General: No focal deficit present.     Mental Status: He is alert and oriented to person, place, and time.     Cranial Nerves: No cranial nerve deficit.     Sensory: No sensory deficit.     Motor: Weakness (B/l LE - 4/5) present.   Psychiatric:        Mood and Affect: Mood normal.        Behavior: Behavior normal.     BP 126/80   Pulse 81   Ht 6' (1.829 m)   Wt 205 lb (93 kg)   SpO2 93%   BMI 27.80 kg/m  Wt Readings from Last 3 Encounters:  11/28/23 205 lb (93 kg)  09/20/23 207 lb (93.9 kg)  09/19/23 207 lb 9.6 oz (94.2 kg)    Lab Results  Component Value Date   TSH 0.895 02/21/2023   Lab Results  Component Value Date   WBC 10.7 07/23/2023   HGB 17.1 07/23/2023   HCT 48.3 07/23/2023   MCV 91 07/23/2023   PLT 239 07/23/2023   Lab Results  Component Value Date   NA 143 07/23/2023   K 3.8 07/23/2023   CO2 26 07/23/2023   GLUCOSE 107 (H) 07/23/2023   BUN 12 07/23/2023   CREATININE 0.91 07/23/2023   BILITOT 2.0 (H) 07/23/2023   ALKPHOS 128 (H) 07/23/2023   AST 23 07/23/2023   ALT 33 07/23/2023   PROT 6.7 07/23/2023   ALBUMIN 4.2 07/23/2023   CALCIUM 9.7 07/23/2023   ANIONGAP 9 03/28/2017   EGFR 100 07/23/2023   Lab Results  Component Value Date  CHOL 176 02/21/2023   Lab Results  Component Value Date   HDL 59 02/21/2023   Lab Results  Component Value Date   LDLCALC 96 02/21/2023   Lab Results  Component Value Date   TRIG 116 02/21/2023   Lab Results  Component Value Date   CHOLHDL 3.0 02/21/2023   Lab Results  Component Value Date   HGBA1C 5.1 02/21/2023      Assessment & Plan:   Problem List Items Addressed This Visit       Cardiovascular and Mediastinum   Essential hypertension -  Primary   BP Readings from Last 1 Encounters:  11/28/23 126/80   Well controlled with amlodipine -olmesartan  10-40 mg once daily Counseled for compliance with the medications Advised DASH diet and moderate exercise/walking, at least 150 mins/week        Respiratory   OSA (obstructive sleep apnea)   Untreated sleep apnea likely contributing to elevated BP and also periodic limb movement Followed by sleep specialist - planned to start CPAP treatment with nasal pillow, he has not tolerated full face mask in the past        Other   Spinal stenosis of lumbar region with neurogenic claudication   S/p lumbar spine surgery X 2 Used to take oral opioids On Gabapentin  300 mg at bedtime now      GAD (generalized anxiety disorder)   Well-controlled with Cymbalta  30 mg QD and Xanax  1 mg 3 times daily Advised to keep Xanax  dose to maximum 3 times daily PDMP reviewed Advised to avoid marijuana products      Erythrocytosis   Likely due to OSA Hb ~ 17 Recheck CBC - if persistently elevated Hb, may advise Phlebotomy         No orders of the defined types were placed in this encounter.   Follow-up: Return in about 4 months (around 03/29/2024) for HTN and GAD.    Suzzane MARLA Blanch, MD

## 2023-11-28 NOTE — Patient Instructions (Signed)
Please continue to take medications as prescribed.  Please continue to follow low salt diet and perform moderate exercise/walking at least 150 mins/week. 

## 2023-11-28 NOTE — Assessment & Plan Note (Signed)
 S/p lumbar spine surgery X 2 Used to take oral opioids On Gabapentin  300 mg at bedtime now

## 2023-11-28 NOTE — Assessment & Plan Note (Addendum)
 Untreated sleep apnea likely contributing to elevated BP and also periodic limb movement Followed by sleep specialist - planned to start CPAP treatment with nasal pillow, he has not tolerated full face mask in the past

## 2023-11-28 NOTE — Assessment & Plan Note (Signed)
 Likely due to OSA Hb ~ 17 Recheck CBC - if persistently elevated Hb, may advise Phlebotomy

## 2023-11-29 ENCOUNTER — Ambulatory Visit: Payer: Self-pay | Admitting: Internal Medicine

## 2023-11-29 LAB — CBC WITH DIFFERENTIAL/PLATELET
Basophils Absolute: 0.1 10*3/uL (ref 0.0–0.2)
Basos: 1 %
EOS (ABSOLUTE): 0.3 10*3/uL (ref 0.0–0.4)
Eos: 3 %
Hematocrit: 48.6 % (ref 37.5–51.0)
Hemoglobin: 17 g/dL (ref 13.0–17.7)
Immature Grans (Abs): 0 10*3/uL (ref 0.0–0.1)
Immature Granulocytes: 0 %
Lymphocytes Absolute: 1.7 10*3/uL (ref 0.7–3.1)
Lymphs: 17 %
MCH: 33.1 pg — ABNORMAL HIGH (ref 26.6–33.0)
MCHC: 35 g/dL (ref 31.5–35.7)
MCV: 95 fL (ref 79–97)
Monocytes Absolute: 0.9 10*3/uL (ref 0.1–0.9)
Monocytes: 9 %
Neutrophils Absolute: 7.1 10*3/uL — ABNORMAL HIGH (ref 1.4–7.0)
Neutrophils: 70 %
Platelets: 248 10*3/uL (ref 150–450)
RBC: 5.13 x10E6/uL (ref 4.14–5.80)
RDW: 13.1 % (ref 11.6–15.4)
WBC: 10.2 10*3/uL (ref 3.4–10.8)

## 2023-11-29 LAB — CMP14+EGFR
ALT: 24 IU/L (ref 0–44)
AST: 21 IU/L (ref 0–40)
Albumin: 4.4 g/dL (ref 3.8–4.9)
Alkaline Phosphatase: 130 IU/L — ABNORMAL HIGH (ref 44–121)
BUN/Creatinine Ratio: 10 (ref 9–20)
BUN: 8 mg/dL (ref 6–24)
Bilirubin Total: 0.7 mg/dL (ref 0.0–1.2)
CO2: 24 mmol/L (ref 20–29)
Calcium: 9.7 mg/dL (ref 8.7–10.2)
Chloride: 102 mmol/L (ref 96–106)
Creatinine, Ser: 0.83 mg/dL (ref 0.76–1.27)
Globulin, Total: 2.5 g/dL (ref 1.5–4.5)
Glucose: 94 mg/dL (ref 70–99)
Potassium: 3.8 mmol/L (ref 3.5–5.2)
Sodium: 143 mmol/L (ref 134–144)
Total Protein: 6.9 g/dL (ref 6.0–8.5)
eGFR: 104 mL/min/{1.73_m2} (ref >59)

## 2023-12-03 ENCOUNTER — Other Ambulatory Visit: Payer: Self-pay | Admitting: Internal Medicine

## 2023-12-18 ENCOUNTER — Other Ambulatory Visit: Payer: Self-pay

## 2023-12-18 ENCOUNTER — Ambulatory Visit

## 2023-12-18 DIAGNOSIS — R9389 Abnormal findings on diagnostic imaging of other specified body structures: Secondary | ICD-10-CM

## 2023-12-18 NOTE — Progress Notes (Unsigned)
 @Patient  ID: Chad Foster, male    DOB: 1970-07-31, 53 y.o.   MRN: [id number]  No chief complaint on file.   Referring provider: Tobie Suzzane POUR, MD  HPI: 53 year old male, former smoker. PMH significant for HTN, OSA, GERD, GAD, RLS.   Previous [facility] pulmonary encounter: 09/19/2023 Discussed the use of AI scribe software for clinical note transcription with the patient, who gave verbal consent to proceed.  History of Present Illness   Chad Foster is a 53 year old male with moderate to severe sleep apnea who presents for follow-up on sleep disturbances.  He was initially seen in December for sleep disturbances, including bad dreams, waking up at night, and excessive daytime sleepiness. He was diagnosed with moderate to severe sleep apnea in 2015 and was previously on CPAP therapy, which he discontinued due to perceived ineffectiveness and discomfort.  In January, his gabapentin  dose was increased to 300 mg at bedtime to help with restless leg symptoms. Since starting gabapentin , he has only had one episode of bad dreams, but he continues to wake up frequently at night without a clear cause. He typically wakes up every two to four hours, sometimes due to discomfort from the rods in his back. His fiance has noticed less leg movement since starting gabapentin .  He describes his sleep pattern as lying down around 9:30 PM, waking up at 11:45 PM, and then again at 2:00 AM, 3:30 AM, and 5:00 AM, after which he stays awake. He sometimes experiences sore legs in the morning, suggesting possible movement during sleep.  He has a history of smoking but quit about 20 years ago after his father passed away from lung cancer. He was a heavy smoker, consuming about two and a half packs a day. He attributes some of his shortness of breath to being out of shape due to being on disability. No current shortness of breath or cough.    1. Hx of sleep apnea (Primary) - Home sleep test; Future  2. Chronic  bronchitis, unspecified chronic bronchitis type (HCC) - DG Chest 2 View; Future  3. Restless legs syndrome  4. Nocturnal hypoxia  Assessment and Plan    Sleep Apnea Moderate to severe sleep apnea diagnosed in 2015. Recent in-lab sleep study inconclusive. Symptoms suggestive of sleep apnea. Home sleep study planned for reassessment. CPAP with nasal mask to be retried if confirmed. Inspire implant considered if CPAP intolerable. - Order home sleep study. - Retry CPAP with nasal mask if sleep apnea confirmed. - Consider ENT referral for Inspire implant evaluation if CPAP intolerable. - Discuss Inspire implant procedure.  Nocturnal Hypoxia Previous study indicated nocturnal hypoxia. Not on nocturnal oxygen, suspect related to undiagnosed OSA. PFTs were normal with evidence of obstructive lung disease. Reassessment needed due to insufficient sleep time in prior study. - Reassess nocturnal hypoxia with home sleep study.  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 Reduced leg movement and fewer bad dreams reported. - Continue gabapentin  300 mg at bedtime.  Chronic Bronchitis Chest x-ray showed lung markings. Normal lung function and diffusion capacity. No obstructive lung disease or COPD. Smoking history noted. - Repeat chest x-ray in one year.      12/19/2023- Interim hx  Discussed the use of AI scribe software for clinical note transcription with the patient, who gave verbal consent to proceed.  History of Present Illness   A 53 year old male with sleep apnea who presents for a follow-up visit.  He has  a history of sleep apnea, initially diagnosed in 2015. An in-lab sleep study was inconclusive, but symptoms were suggestive of sleep apnea. A recent home sleep study confirmed mild sleep apnea with an average of 6.1 pauses per hour. His oxygen levels dropped significantly during sleep, reaching as low as 82%, with 78 minutes spent  below 88%.  He has a history of smoking, having quit approximately 20 years ago after smoking heavily for 25 to 28 years at a rate of 2 to 3 packs per day. No current respiratory symptoms such as cough, wheezing, or chest tightness, but experiences mild dyspnea, which he attributes to being out of shape due to decreased exercise.  He is currently taking gabapentin  300 mg at bedtime for periodic limb movement disorder, which has been effective in managing his symptoms. He is concerned about potential cognitive risks associated with gabapentin  but is on a low dose.  A recent chest x-ray showed coarsened bronchial findings, and he has a history of low oxygen levels at night. A breathing test was normal, showing no evidence of obstructive lung disease or COPD.       Pulmonary function testing 09/19/2023 PFT >> FVC 4.93 (99%), FEV1 3.83 (100%), ratio 78, DLCOunc 100%  No Known Allergies  Immunization History  Administered Date(s) Administered   Zoster Recombinant(Shingrix) 06/15/2022, 08/17/2022    Past Medical History:  Diagnosis Date   Anxiety 1996   Depression 1996   Enlarged prostate    but no meds required at present   GERD (gastroesophageal reflux disease)    takes Dexilant  daily   Hypertension    takes benicar  daily   Insomnia    Neuromuscular disorder (HCC) 1997   Sleep apnea 1996   Weakness    bilateral legs d/t back problems    Tobacco History: Social History   Tobacco Use  Smoking Status Former   Current packs/day: 1.50   Average packs/day: 1.6 packs/day for 18.8 years (30.1 ttl pk-yrs)   Types: Cigarettes, Cigars  Smokeless Tobacco Current  Tobacco Comments   quit 16 yrs ago, THC gummies   Ready to quit: Not Answered Counseling given: Not Answered Tobacco comments: quit 16 yrs ago, THC gummies   Outpatient Medications Prior to Visit  Medication Sig Dispense Refill   ALPRAZolam  (XANAX ) 1 MG tablet Take 1 tablet (1 mg total) by mouth 3 (three) times daily  as needed for sleep or anxiety. 90 tablet 2   amLODipine -olmesartan  (AZOR ) 10-40 MG tablet TAKE ONE TABLET BY MOUTH DAILY 90 tablet 1   DULoxetine  (CYMBALTA ) 30 MG capsule Take 1 capsule (30 mg total) by mouth daily. 90 capsule 1   folic acid  (FOLVITE ) 1 MG tablet Take 1 tablet (1 mg total) by mouth daily. 30 tablet 0   gabapentin  (NEURONTIN ) 300 MG capsule Take 1 capsule (300 mg total) by mouth at bedtime. 30 capsule 5   OVER THE COUNTER MEDICATION Diltiazem  mg unknown one bid.     sildenafil  (VIAGRA ) 100 MG tablet Take 1 tablet (100 mg total) by mouth as needed for erectile dysfunction. 30 tablet 1   No facility-administered medications prior to visit.      Review of Systems  Review of Systems  Constitutional: Negative.   HENT: Negative.  Negative for congestion.   Respiratory: Negative.  Negative for cough and wheezing.   Cardiovascular: Negative.   Psychiatric/Behavioral:  Positive for sleep disturbance.      Physical Exam  There were no vitals taken for this visit. Physical Exam Constitutional:  Appearance: Normal appearance.  HENT:     Head: Normocephalic and atraumatic.  Cardiovascular:     Rate and Rhythm: Normal rate and regular rhythm.  Pulmonary:     Effort: Pulmonary effort is normal.     Breath sounds: Normal breath sounds. No wheezing or rhonchi.  Musculoskeletal:        General: Normal range of motion.  Skin:    General: Skin is warm and dry.  Neurological:     General: No focal deficit present.     Mental Status: He is alert. Mental status is at baseline. He is disoriented.  Psychiatric:        Mood and Affect: Mood normal.        Behavior: Behavior normal.        Thought Content: Thought content normal.        Judgment: Judgment normal.      Lab Results:  CBC    Component Value Date/Time   WBC 10.2 11/28/2023 1353   WBC 9.8 03/28/2017 2107   RBC 5.13 11/28/2023 1353   RBC 5.10 03/28/2017 2107   HGB 17.0 11/28/2023 1353   HCT 48.6  11/28/2023 1353   PLT 248 11/28/2023 1353   MCV 95 11/28/2023 1353   MCH 33.1 (H) 11/28/2023 1353   MCH 32.9 03/28/2017 2107   MCHC 35.0 11/28/2023 1353   MCHC 35.9 03/28/2017 2107   RDW 13.1 11/28/2023 1353   LYMPHSABS 1.7 11/28/2023 1353   MONOABS 1.0 03/28/2017 2107   EOSABS 0.3 11/28/2023 1353   BASOSABS 0.1 11/28/2023 1353    BMET    Component Value Date/Time   NA 143 11/28/2023 1353   K 3.8 11/28/2023 1353   CL 102 11/28/2023 1353   CO2 24 11/28/2023 1353   GLUCOSE 94 11/28/2023 1353   GLUCOSE 107 (H) 03/28/2017 2107   BUN 8 11/28/2023 1353   CREATININE 0.83 11/28/2023 1353   CALCIUM 9.7 11/28/2023 1353   GFRNONAA >60 03/28/2017 2107   GFRAA >60 03/28/2017 2107    BNP No results found for: BNP  ProBNP No results found for: PROBNP  Imaging: No results found.   Assessment & Plan:   1. Mild sleep apnea (Primary) - Ambulatory Referral for DME - Pulse oximetry, overnight; Future   Assessment and Plan    Mild Sleep Apnea Mild sleep apnea confirmed with a home sleep study showing an average of 6.1 pauses per hour. Significant oxygen desaturation with levels dropping to 82% and 78 minutes spent with oxygen levels below 88%. Symptoms include increased snoring.  - Order auto CPAP 5-15cm h20 with mask of choice and heated humidity  - Instruct on CPAP compliance: wear every night for at least 4 hours, with compliance defined as 70% of nights - Educate on CPAP maintenance: change mask cushion every 2-4 weeks, tubing every 3 months, filter monthly, headgear and water chamber every 6 months - Check overnight oximetry test on CPAP to re-assess nocturnal hypoxemia  - Schedule follow-up in 6-8 weeks, potentially virtually, to assess CPAP compliance  Periodic Limb Movement Disorder Periodic limb movement disorder previously noted during in-lab sleep studies. Currently managed with gabapentin  300 mg at bedtime, which has been effective. Discussed potential cognitive  risks associated with gabapentin , particularly at higher doses. Plan to reassess need for gabapentin  after CPAP therapy is initiated, as CPAP may improve periodic limb movements. - Continue gabapentin  300 mg at bedtime - Consider tapering gabapentin  dose to 100 mg after CPAP therapy is established - Monitor  for cognitive changes and reassess gabapentin  use if necessary  Former smoker  Heavy smoking history, 2-3 packs per day, quit approximately 20 years ago. Chest x-ray in January showed coarsened bronchial markings. Breathing test normal, but oxygen levels lower than expected for mild sleep apnea. No current respiratory symptoms such as cough, wheezing, or hemoptysis. - Review chest x-ray results when available - Order chest CT if bronchial markings persist to rule out RBILD or fibrosis - Monitor for respiratory symptoms and progression of any lung changes   Almarie LELON Ferrari, NP 12/18/2023

## 2023-12-18 NOTE — Progress Notes (Signed)
 I called and spoke to pt regarding his appointment tomorrow. Per LOV, pt was suppose to have a cxr 2-3 days prior to his appt which was done completed. I informed pt that he needs to come in 30 minutes earlier to have the CXR done before he is seen. Pt verbalized understanding. CXR ordered. NFN

## 2023-12-19 ENCOUNTER — Encounter: Payer: Self-pay | Admitting: Primary Care

## 2023-12-19 ENCOUNTER — Ambulatory Visit: Payer: Self-pay | Admitting: Primary Care

## 2023-12-19 ENCOUNTER — Ambulatory Visit (INDEPENDENT_AMBULATORY_CARE_PROVIDER_SITE_OTHER)

## 2023-12-19 ENCOUNTER — Ambulatory Visit: Admitting: Primary Care

## 2023-12-19 VITALS — BP 128/77 | HR 65 | Ht 72.0 in | Wt 206.2 lb

## 2023-12-19 DIAGNOSIS — R9389 Abnormal findings on diagnostic imaging of other specified body structures: Secondary | ICD-10-CM | POA: Diagnosis not present

## 2023-12-19 DIAGNOSIS — G4730 Sleep apnea, unspecified: Secondary | ICD-10-CM

## 2023-12-19 DIAGNOSIS — R918 Other nonspecific abnormal finding of lung field: Secondary | ICD-10-CM | POA: Diagnosis not present

## 2023-12-19 NOTE — Patient Instructions (Signed)
  VISIT SUMMARY: You came in today for a follow-up visit regarding your sleep apnea. We discussed your recent home sleep study, which confirmed mild sleep apnea, and reviewed your history of smoking and current respiratory health. We also talked about your treatment for periodic limb movement disorder and the results of your recent chest x-ray.  YOUR PLAN: -MILD SLEEP APNEA: Mild sleep apnea means you have brief pauses in breathing during sleep, which can lower your oxygen levels. We will start you on a CPAP machine to help keep your airway open while you sleep. Please use the CPAP every night for at least 4 hours, aiming for 70% compliance. Follow the maintenance schedule for the CPAP equipment, and we will check in with you in 6-8 weeks to see how you are doing.  -PERIODIC LIMB MOVEMENT DISORDER: Periodic limb movement disorder causes involuntary leg movements during sleep. You are currently taking gabapentin  300 mg at bedtime, which is helping. We discussed the potential cognitive risks of gabapentin , especially at higher doses. After you start using the CPAP, we may consider reducing your gabapentin  dose to 100 mg. Please monitor for any cognitive changes and let us  know if you have any concerns.  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 Your recent chest x-ray showed some bronchitic changes, but your breathing test was normal. We will review your chest x-ray results and may order a chest CT if the bronchial markings persist. Please watch for any new respiratory symptoms and let us  know if you notice any changes.  INSTRUCTIONS: Please follow up in 6-8 weeks to assess your CPAP compliance. This follow-up may be done virtually. Continue taking gabapentin  300 mg at bedtime and monitor for any cognitive changes. We will review your chest x-ray results and may order further imaging if needed. Let  us  know if you experience any new respiratory symptoms.  Follow-up 8 weeks with Landry NP for CPAP compliance/ Virtual visit ok

## 2023-12-19 NOTE — Progress Notes (Signed)
 I called and spoke to pt. Pt informed of Beth's note and verbalized understanding. NFN

## 2023-12-19 NOTE — Progress Notes (Signed)
 Please let patient know CXR was clear, no indication for CT imaging. Start CPAP and we will get an overnight oximetry on therapy after staring

## 2023-12-27 ENCOUNTER — Other Ambulatory Visit: Payer: Self-pay | Admitting: Internal Medicine

## 2024-02-04 ENCOUNTER — Other Ambulatory Visit: Payer: Self-pay | Admitting: Internal Medicine

## 2024-02-04 DIAGNOSIS — G2581 Restless legs syndrome: Secondary | ICD-10-CM

## 2024-02-07 ENCOUNTER — Telehealth: Admitting: Primary Care

## 2024-02-07 ENCOUNTER — Telehealth: Payer: Self-pay | Admitting: Primary Care

## 2024-02-07 DIAGNOSIS — G4733 Obstructive sleep apnea (adult) (pediatric): Secondary | ICD-10-CM | POA: Diagnosis not present

## 2024-02-07 DIAGNOSIS — Z87891 Personal history of nicotine dependence: Secondary | ICD-10-CM | POA: Diagnosis not present

## 2024-02-07 NOTE — Telephone Encounter (Signed)
 Patient needs CPAP follow-up with me in 6-8 weeks, virtual visit is alright

## 2024-02-07 NOTE — Patient Instructions (Addendum)
  VISIT SUMMARY: Today, you had a follow-up visit to discuss your mild sleep apnea. You shared that you are using your CPAP machine consistently but still wake up at least twice a night. You feel the initial pressure setting is too low, causing some discomfort. Despite this, you have noticed feeling more energetic in the mornings since starting CPAP therapy.  YOUR PLAN: -OBSTRUCTIVE SLEEP APNEA: Obstructive sleep apnea is a condition where your breathing stops and starts repeatedly during sleep due to blocked airways. We will adjust your CPAP pressure settings to address the initial low pressure sensation. An overnight oximetry test will be ordered to assess your oxygen levels while using the CPAP at home. Please report via MyChart if the pressure adjustments do not improve your symptoms. We will schedule a follow-up in 6 to 8 weeks to review the oximetry results and the effectiveness of your CPAP therapy. If your symptoms persist or the oximetry results indicate a need, we may consider a formal titration study for further intervention.  INSTRUCTIONS: Please complete the overnight oximetry test as ordered. Report any issues with the CPAP pressure adjustments via MyChart. Schedule a follow-up appointment in 6 to 8 weeks to review your oximetry results and CPAP efficacy.

## 2024-02-07 NOTE — Progress Notes (Signed)
 Virtual Visit via Video Note  I connected with Chad Foster on 02/07/24 at  2:00 PM EDT by a video enabled telemedicine application and verified that I am speaking with the correct person using two identifiers.  Location: Patient: Home Provider: Office   I discussed the limitations of evaluation and management by telemedicine and the availability of in person appointments. The patient expressed understanding and agreed to proceed.  History of Present Illness: 53 year old male, former smoker. PMH significant for HTN, OSA, GERD, GAD, RLS.   Previous [facility] pulmonary encounter: 09/19/2023 Discussed the use of AI scribe software for clinical note transcription with the patient, who gave verbal consent to proceed.  History of Present Illness   Chad Foster is a 53 year old male with moderate to severe sleep apnea who presents for follow-up on sleep disturbances.  He was initially seen in December for sleep disturbances, including bad dreams, waking up at night, and excessive daytime sleepiness. He was diagnosed with moderate to severe sleep apnea in 2015 and was previously on CPAP therapy, which he discontinued due to perceived ineffectiveness and discomfort.  In January, his gabapentin  dose was increased to 300 mg at bedtime to help with restless leg symptoms. Since starting gabapentin , he has only had one episode of bad dreams, but he continues to wake up frequently at night without a clear cause. He typically wakes up every two to four hours, sometimes due to discomfort from the rods in his back. His fiance has noticed less leg movement since starting gabapentin .  He describes his sleep pattern as lying down around 9:30 PM, waking up at 11:45 PM, and then again at 2:00 AM, 3:30 AM, and 5:00 AM, after which he stays awake. He sometimes experiences sore legs in the morning, suggesting possible movement during sleep.  He has a history of smoking but quit about 20 years ago after his father  passed away from lung cancer. He was a heavy smoker, consuming about two and a half packs a day. He attributes some of his shortness of breath to being out of shape due to being on disability. No current shortness of breath or cough.    1. Hx of sleep apnea (Primary) - Home sleep test; Future  2. Chronic bronchitis, unspecified chronic bronchitis type (HCC) - DG Chest 2 View; Future  3. Restless legs syndrome  4. Nocturnal hypoxia  Assessment and Plan    Sleep Apnea Moderate to severe sleep apnea diagnosed in 2015. Recent in-lab sleep study inconclusive. Symptoms suggestive of sleep apnea. Home sleep study planned for reassessment. CPAP with nasal mask to be retried if confirmed. Inspire implant considered if CPAP intolerable. - Order home sleep study. - Retry CPAP with nasal mask if sleep apnea confirmed. - Consider ENT referral for Inspire implant evaluation if CPAP intolerable. - Discuss Inspire implant procedure.  Nocturnal Hypoxia Previous study indicated nocturnal hypoxia. Not on nocturnal oxygen, suspect related to undiagnosed OSA. PFTs were normal with evidence of obstructive lung disease. Reassessment needed due to insufficient sleep time in prior study. - Reassess nocturnal hypoxia with home sleep study.  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 Reduced leg movement and fewer bad dreams reported. - Continue gabapentin  300 mg at bedtime.  Chronic Bronchitis Chest x-ray showed lung markings. Normal lung function and diffusion capacity. No obstructive lung disease or COPD. Smoking history noted. - Repeat chest x-ray in one year.      12/19/2023 Discussed the use of AI  scribe software for clinical note transcription with the patient, who gave verbal consent to proceed.  History of Present Illness   A 53 year old male with sleep apnea who presents for a follow-up visit.  He has a history of sleep apnea, initially  diagnosed in 2015. An in-lab sleep study was inconclusive, but symptoms were suggestive of sleep apnea. A recent home sleep study confirmed mild sleep apnea with an average of 6.1 pauses per hour. His oxygen levels dropped significantly during sleep, reaching as low as 82%, with 78 minutes spent below 88%.  He has a history of smoking, having quit approximately 20 years ago after smoking heavily for 25 to 28 years at a rate of 2 to 3 packs per day. No current respiratory symptoms such as cough, wheezing, or chest tightness, but experiences mild dyspnea, which he attributes to being out of shape due to decreased exercise.  He is currently taking gabapentin  300 mg at bedtime for periodic limb movement disorder, which has been effective in managing his symptoms. He is concerned about potential cognitive risks associated with gabapentin  but is on a low dose.  A recent chest x-ray showed coarsened bronchial findings, and he has a history of low oxygen levels at night. A breathing test was normal, showing no evidence of obstructive lung disease or COPD.       02/07/2024- Interim hx  Discussed the use of AI scribe software for clinical note transcription with the patient, who gave verbal consent to proceed.  History of Present Illness Chad Foster is a 53 year old male who presents for a follow-up visit for mild sleep apnea.  He reports a history of mild sleep apnea and recalls a home sleep study that showed 6.1 apneic events per hour and significant oxygen desaturation. He uses a CPAP machine consistently, with a pressure setting of auto 5 to 15, and a residual apnea score of 0.8. Despite this, he continues to wake up at least twice a night and feels that the initial pressure setting of 4 is too low, causing a sensation of restriction. He states, 'I breathe better without it, kind of,' and needs to adjust his breathing to sync with the machine. Despite waking up during the night, he feels more energetic in  the mornings since using the CPAP.  He takes Xanax  three times a day as needed for sleep or anxiety, and he takes it at bedtime. He does not have trouble falling asleep but feels the CPAP pressure is insufficient initially, which affects his ability to adjust comfortably.  His current medications include amlodipine  for blood pressure, duloxetine , folic acid , gabapentin , and sildenafil  as needed. He no longer takes diltiazem  as amlodipine  has replaced it.   Airview download 01/07/24-02/05/24 Usage 30/30 days (100%); 27 days (90%) > 4 hours Average usage days used 5 hours 5 mins  Pressure 5-15cm h20 (8cm h20-95%) Airleaks 18.3L/min AHI 0.8   Observations/Objective:  Appears well without overt respiratory symptoms  Assessment and Plan:  1. OSA (obstructive sleep apnea) (Primary) - Pulse oximetry, overnight; Future  Assessment & Plan Obstructive sleep apnea Mild obstructive sleep apnea with 6.1 apneic events per hour and significant oxygen desaturation on home sleep study. Current CPAP settings are auto 5 to 15 cm H2O with a residual apnea score of 0.8. He reports 100% compliance with CPAP use but experiences waking up twice per night and feels initial pressure is low. Reports feeling more energetic in the mornings despite waking up. Adjust CPAP pressure settings  to address initial low pressure sensation. - Adjust CPAP settings 6-13cm h20; turn off RAMP and Increased EPR level from 2-3 to help with tolerance  - Order overnight oximetry test to assess oxygen levels while using CPAP at home. - Instruct him to report via MyChart if pressure adjustments do not improve symptoms. - Consider formal titration study if symptoms persist or oximetry results indicate need for further intervention.   Follow Up Instructions:  - Schedule follow-up in 6 to 8 weeks to review oximetry results and CPAP efficacy.   I discussed the assessment and treatment plan with the patient. The patient was provided an  opportunity to ask questions and all were answered. The patient agreed with the plan and demonstrated an understanding of the instructions.   The patient was advised to call back or seek an in-person evaluation if the symptoms worsen or if the condition fails to improve as anticipated.  I provided 22 minutes of non-face-to-face time during this encounter.   Almarie LELON Ferrari, NP

## 2024-02-10 ENCOUNTER — Telehealth: Payer: Self-pay | Admitting: Primary Care

## 2024-02-10 DIAGNOSIS — G4733 Obstructive sleep apnea (adult) (pediatric): Secondary | ICD-10-CM

## 2024-02-10 NOTE — Telephone Encounter (Signed)
 Patient states that his CPAP is running out of water after about 4 1/2 hours.  He feels the pressure may be up a little too high.  Can he have the pressure lowered a very small amount?  About 2 cm?  Patient thinks this will help with not using so much water during the night. Please advise.

## 2024-02-10 NOTE — Telephone Encounter (Signed)
 Patient states that when using his cpap it is running out of water during the night. Maybe it needs to be turned down a little lower.

## 2024-02-10 NOTE — Telephone Encounter (Signed)
 Patient scheduled.

## 2024-02-11 NOTE — Telephone Encounter (Signed)
 I called and spoke with the pt and notified of response from Beth  Pt verbalized understanding  Nothing further needed

## 2024-02-11 NOTE — Telephone Encounter (Signed)
 I'll change CPAP from auto settings 6-13cm h20 to a set pressure 8cm h20 with 30 mins ramp time and lower humidification and bring EPR back to level 2

## 2024-02-14 NOTE — Telephone Encounter (Signed)
 Humidification in on and set at level 2, 76 degrees. Is he having any dry mouth symptoms, if not and tolerating continue at current settings. Glad the pressure settings are working well for him

## 2024-02-14 NOTE — Telephone Encounter (Signed)
 Please advise, Im not there to do a DL I assume you may need one

## 2024-02-18 NOTE — Telephone Encounter (Signed)
 fyi

## 2024-02-21 ENCOUNTER — Other Ambulatory Visit: Payer: Self-pay | Admitting: Internal Medicine

## 2024-02-21 DIAGNOSIS — F411 Generalized anxiety disorder: Secondary | ICD-10-CM

## 2024-02-21 MED ORDER — ALPRAZOLAM 1 MG PO TABS: 1.0000 mg | ORAL_TABLET | Freq: Three times a day (TID) | ORAL | 1 refills | Status: DC | PRN

## 2024-02-24 NOTE — Telephone Encounter (Signed)
 Download reviewed  Usage 13/13 days Pressure 8cm h20 AHI 1.1  Current humidification is level 3 at 72 degrees  How is he doing. Things look ok on my end, he continue to increase humidification on CPAP Events are well controlled Consider chin strap if sleeps with mouth open

## 2024-03-02 DIAGNOSIS — R0902 Hypoxemia: Secondary | ICD-10-CM | POA: Diagnosis not present

## 2024-03-02 DIAGNOSIS — G4730 Sleep apnea, unspecified: Secondary | ICD-10-CM | POA: Diagnosis not present

## 2024-03-03 ENCOUNTER — Encounter: Payer: Self-pay | Admitting: Primary Care

## 2024-03-03 ENCOUNTER — Ambulatory Visit: Payer: Self-pay | Admitting: Primary Care

## 2024-03-04 ENCOUNTER — Other Ambulatory Visit: Payer: Self-pay | Admitting: Internal Medicine

## 2024-03-04 DIAGNOSIS — G2581 Restless legs syndrome: Secondary | ICD-10-CM

## 2024-03-04 NOTE — Progress Notes (Signed)
 Called and spoke to patient - advised of ONO results per Landry Ferrari. Patient verbalized understanding, NFN.

## 2024-03-06 ENCOUNTER — Other Ambulatory Visit: Payer: Self-pay | Admitting: Internal Medicine

## 2024-03-06 DIAGNOSIS — F411 Generalized anxiety disorder: Secondary | ICD-10-CM

## 2024-03-06 NOTE — Telephone Encounter (Signed)
 Pt wants to know about nightmares and whether an increased dose of Gabapentin  would help

## 2024-03-06 NOTE — Telephone Encounter (Addendum)
 When did the nightmares start, how often is he having them? Gabapentin  could possibly help, however, not typically used for nightmares- we could try 400mg  if he would like since he is already on the medication and tolerating well  Continue CPAP at pressure settings 8cm h20

## 2024-03-09 MED ORDER — GABAPENTIN 400 MG PO CAPS: 400.0000 mg | ORAL_CAPSULE | Freq: Every day | ORAL | 2 refills | Status: DC

## 2024-03-09 NOTE — Addendum Note (Signed)
 Addended by: HOPE ALMARIE ORN on: 03/09/2024 04:37 PM   Modules accepted: Orders

## 2024-03-09 NOTE — Telephone Encounter (Signed)
**Note De-identified  [place] Obfuscation** Please advise 

## 2024-03-09 NOTE — Telephone Encounter (Signed)
 RX sent, schedule visit in 4=6 weeks

## 2024-03-18 ENCOUNTER — Encounter (INDEPENDENT_AMBULATORY_CARE_PROVIDER_SITE_OTHER): Payer: Self-pay | Admitting: Gastroenterology

## 2024-03-27 ENCOUNTER — Telehealth: Admitting: Primary Care

## 2024-03-27 DIAGNOSIS — G2581 Restless legs syndrome: Secondary | ICD-10-CM

## 2024-03-27 DIAGNOSIS — G4752 REM sleep behavior disorder: Secondary | ICD-10-CM | POA: Diagnosis not present

## 2024-03-27 DIAGNOSIS — G4761 Periodic limb movement disorder: Secondary | ICD-10-CM

## 2024-03-27 DIAGNOSIS — J42 Unspecified chronic bronchitis: Secondary | ICD-10-CM

## 2024-03-27 DIAGNOSIS — G4733 Obstructive sleep apnea (adult) (pediatric): Secondary | ICD-10-CM

## 2024-03-27 DIAGNOSIS — Z87891 Personal history of nicotine dependence: Secondary | ICD-10-CM | POA: Diagnosis not present

## 2024-03-27 DIAGNOSIS — R0902 Hypoxemia: Secondary | ICD-10-CM | POA: Diagnosis not present

## 2024-03-27 DIAGNOSIS — G4730 Sleep apnea, unspecified: Secondary | ICD-10-CM

## 2024-03-27 NOTE — Progress Notes (Signed)
 Virtual Visit via Video Note  I connected with Chad Foster on 03/27/24 at  2:00 PM EDT by a video enabled telemedicine application and verified that I am speaking with the correct person using two identifiers.  Location: Patient: Home Provider: Office   I discussed the limitations of evaluation and management by telemedicine and the availability of in person appointments. The patient expressed understanding and agreed to proceed.  History of Present Illness: 53 year old male, former smoker. PMH significant for HTN, OSA, GERD, GAD, RLS.   Previous [facility] pulmonary encounter: 09/19/2023 Discussed the use of AI scribe software for clinical note transcription with the patient, who gave verbal consent to proceed.  History of Present Illness   Chad Foster is a 53 year old male with moderate to severe sleep apnea who presents for follow-up on sleep disturbances.  He was initially seen in December for sleep disturbances, including bad dreams, waking up at night, and excessive daytime sleepiness. He was diagnosed with moderate to severe sleep apnea in 2015 and was previously on CPAP therapy, which he discontinued due to perceived ineffectiveness and discomfort.  In January, his gabapentin  dose was increased to 300 mg at bedtime to help with restless leg symptoms. Since starting gabapentin , he has only had one episode of bad dreams, but he continues to wake up frequently at night without a clear cause. He typically wakes up every two to four hours, sometimes due to discomfort from the rods in his back. His fiance has noticed less leg movement since starting gabapentin .  He describes his sleep pattern as lying down around 9:30 PM, waking up at 11:45 PM, and then again at 2:00 AM, 3:30 AM, and 5:00 AM, after which he stays awake. He sometimes experiences sore legs in the morning, suggesting possible movement during sleep.  He has a history of smoking but quit about 20 years ago after his father  passed away from lung cancer. He was a heavy smoker, consuming about two and a half packs a day. He attributes some of his shortness of breath to being out of shape due to being on disability. No current shortness of breath or cough.    1. Hx of sleep apnea (Primary) - Home sleep test; Future  2. Chronic bronchitis, unspecified chronic bronchitis type (HCC) - DG Chest 2 View; Future  3. Restless legs syndrome  4. Nocturnal hypoxia  Assessment and Plan    Sleep Apnea Moderate to severe sleep apnea diagnosed in 2015. Recent in-lab sleep study inconclusive. Symptoms suggestive of sleep apnea. Home sleep study planned for reassessment. CPAP with nasal mask to be retried if confirmed. Inspire implant considered if CPAP intolerable. - Order home sleep study. - Retry CPAP with nasal mask if sleep apnea confirmed. - Consider ENT referral for Inspire implant evaluation if CPAP intolerable. - Discuss Inspire implant procedure.  Nocturnal Hypoxia Previous study indicated nocturnal hypoxia. Not on nocturnal oxygen, suspect related to undiagnosed OSA. PFTs were normal with evidence of obstructive lung disease. Reassessment needed due to insufficient sleep time in prior study. - Reassess nocturnal hypoxia with home sleep study.  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 Reduced leg movement and fewer bad dreams reported. - Continue gabapentin  300 mg at bedtime.  Chronic Bronchitis Chest x-ray showed lung markings. Normal lung function and diffusion capacity. No obstructive lung disease or COPD. Smoking history noted. - Repeat chest x-ray in one year.      12/19/2023 Discussed the use of AI  scribe software for clinical note transcription with the patient, who gave verbal consent to proceed.  History of Present Illness   A 53 year old male with sleep apnea who presents for a follow-up visit.  He has a history of sleep apnea, initially  diagnosed in 2015. An in-lab sleep study was inconclusive, but symptoms were suggestive of sleep apnea. A recent home sleep study confirmed mild sleep apnea with an average of 6.1 pauses per hour. His oxygen levels dropped significantly during sleep, reaching as low as 82%, with 78 minutes spent below 88%.  He has a history of smoking, having quit approximately 20 years ago after smoking heavily for 25 to 28 years at a rate of 2 to 3 packs per day. No current respiratory symptoms such as cough, wheezing, or chest tightness, but experiences mild dyspnea, which he attributes to being out of shape due to decreased exercise.  He is currently taking gabapentin  300 mg at bedtime for periodic limb movement disorder, which has been effective in managing his symptoms. He is concerned about potential cognitive risks associated with gabapentin  but is on a low dose.  A recent chest x-ray showed coarsened bronchial findings, and he has a history of low oxygen levels at night. A breathing test was normal, showing no evidence of obstructive lung disease or COPD.       03/27/2024- interim hx  Discussed the use of AI scribe software for clinical note transcription with the patient, who gave verbal consent to proceed.  History of Present Illness Chad Foster is a 53 year old male with mild sleep apnea who presents for follow-up regarding CPAP settings and usage.  He has a history of mild sleep apnea, diagnosed via a sleep study showing six events per hour with significant oxygen desaturation. Initially, he was on auto CPAP settings of 5 to 15 cm H2O, which controlled his apnea well, and he was 100% compliant with usage. However, he experienced waking up twice a night and felt the pressure was low, despite feeling more energetic in the mornings. In September, his settings were adjusted to 6 to 13 cm H2O with the ramp time turned off to address these issues.  Subsequently, he reported that his CPAP was running out  of water after about four and a half hours, and he felt the pressure might be too high. His settings were then changed to a fixed pressure of 8 cm H2O, and his humidification was lowered. He has been adjusting the humidity and temperature settings himself, setting them at 28F and humidity at 3, to accommodate different environmental conditions.  He has experienced issues with his nasal pillow mask not staying on, which he addressed by lowering the pressure to around 4.5 cm H2O. This adjustment improved his mask fit score to 20 out of 20. He did not use the CPAP on the 15th, 16th, and 17th of the month due to nasal congestion, which he attributes to environmental changes. He wants to try a full face mask for use during times of congestion.  He is currently taking gabapentin  400 mg at bedtime, Azor  for blood pressure, Xanax , Cymbalta  30 mg, folic acid , and sildenafil  as needed. No residual daytime grogginess, mood changes, depression, headaches, nausea, or vomiting from his medications.   Observations/Objective:  Appears well without overt respiratory symptoms  Pulmonary function testing 09/19/2023 PFT >> FVC 4.93 (99%), FEV1 3.83 (100%), ratio 78, DLCOunc 100%  Assessment and Plan: 1. OSA (obstructive sleep apnea) (Primary) - Ambulatory Referral for  DME  Assessment & Plan Obstructive sleep apnea Mild obstructive sleep apnea with six events per hour and significant oxygen desaturation on sleep study. Previous CPAP settings were auto settings 5 to 15, adjusted to 6 to 13, and then to a set pressure of 8. Apnea score is well-controlled with less than one event per hour. Reports running out of water during the night and has adjusted pressure to 4.5-4.6 to improve mask fit. Reports nasal congestion affecting CPAP use, especially with nasal pillows. Comfortable with current settings and reports improvement in mask fit and comfort. - Order mask fitting for a full face mask. - Recommend over-the-counter  nasal sprays such as fluticasone or Astapro for congestion. - Encourage CPAP use every night for four hours or longer. - Follow up in six months or sooner if needed.  Restless leg symptoms -Well controlled; Continue Gabapentin  400mg  at bedtime  Follow Up Instructions:   6 months or sooner  I discussed the assessment and treatment plan with the patient. The patient was provided an opportunity to ask questions and all were answered. The patient agreed with the plan and demonstrated an understanding of the instructions.   The patient was advised to call back or seek an in-person evaluation if the symptoms worsen or if the condition fails to improve as anticipated.  I provided 22 minutes of non-face-to-face time during this encounter.   Almarie LELON Ferrari, NP

## 2024-03-30 NOTE — Patient Instructions (Signed)
  VISIT SUMMARY: You came in today for a follow-up regarding your CPAP settings and usage for your mild sleep apnea. We discussed your current settings, issues with your nasal pillow mask, and your experiences with nasal congestion.  YOUR PLAN: -OBSTRUCTIVE SLEEP APNEA: Obstructive sleep apnea is a condition where your airway becomes blocked during sleep, causing breathing interruptions. Your sleep study showed six events per hour with significant oxygen desaturation. Your CPAP settings have been adjusted multiple times to improve your comfort and effectiveness. Currently, you are using a fixed pressure of 8 cm H2O, and you have adjusted the humidity and temperature settings to suit your needs. You have also lowered the pressure to around 4.5 cm H2O to improve the mask fit. We will order a mask fitting for a full face mask to help with nasal congestion, and I recommend using over-the-counter nasal sprays like fluticasone or Astapro for congestion. Please continue using your CPAP every night for at least four hours.  INSTRUCTIONS: Please follow up in six months or sooner if needed.                      Contains text generated by [employer]bridge.                                 Contains text generated by [employer]bridge.

## 2024-03-31 ENCOUNTER — Ambulatory Visit: Admitting: Internal Medicine

## 2024-03-31 ENCOUNTER — Encounter: Payer: Self-pay | Admitting: Internal Medicine

## 2024-03-31 VITALS — BP 119/76 | HR 71 | Ht 72.0 in | Wt 206.8 lb

## 2024-03-31 DIAGNOSIS — I10 Essential (primary) hypertension: Secondary | ICD-10-CM | POA: Diagnosis not present

## 2024-03-31 DIAGNOSIS — E559 Vitamin D deficiency, unspecified: Secondary | ICD-10-CM

## 2024-03-31 DIAGNOSIS — Z125 Encounter for screening for malignant neoplasm of prostate: Secondary | ICD-10-CM | POA: Diagnosis not present

## 2024-03-31 DIAGNOSIS — E782 Mixed hyperlipidemia: Secondary | ICD-10-CM

## 2024-03-31 DIAGNOSIS — Z2821 Immunization not carried out because of patient refusal: Secondary | ICD-10-CM

## 2024-03-31 DIAGNOSIS — F411 Generalized anxiety disorder: Secondary | ICD-10-CM | POA: Diagnosis not present

## 2024-03-31 DIAGNOSIS — Z79899 Other long term (current) drug therapy: Secondary | ICD-10-CM | POA: Diagnosis not present

## 2024-03-31 DIAGNOSIS — M48062 Spinal stenosis, lumbar region with neurogenic claudication: Secondary | ICD-10-CM

## 2024-03-31 DIAGNOSIS — G4733 Obstructive sleep apnea (adult) (pediatric): Secondary | ICD-10-CM

## 2024-03-31 DIAGNOSIS — Z23 Encounter for immunization: Secondary | ICD-10-CM

## 2024-03-31 DIAGNOSIS — R739 Hyperglycemia, unspecified: Secondary | ICD-10-CM

## 2024-03-31 NOTE — Progress Notes (Signed)
 Established Patient Office Visit  Subjective:  Patient ID: Chad Foster, male    DOB: [birth year]  Age: 53 y.o. MRN: [id number]  CC:  Chief Complaint  Patient presents with   Hypertension    4 month f/u    Anxiety    4 month f/u     HPI Chad Foster is a 53 y.o. male with past medical history of HTN, OSA, GAD and lumbar spinal stenosis who presents for f/u of his chronic medical conditions.  HTN: His blood pressure was wnl today.  He currently takes Azor  10-40 mg once daily. Denies any headache, dizziness, chest pain, dyspnea or palpitations.  OSA: He has history of sleep apnea.  He has started using CPAP regularly now.  Last sleep study showed mild OSA with significant oxygen desaturation, followed by Pulmonology.  He also reports periodic limb movements at nighttime and has felt better with Gabapentin . He has tried pramipexole  without much relief.  GAD: He takes Xanax  1 mg 3 times daily now and still takes Cymbalta  30 mg QD.  Denies anhedonia, SI or HI currently. His sleep has remained the same since decreasing Xanax  dose at bedtime to 1 mg from 2 mg.  Lumbar foraminal stenosis: Reports lumbar spine surgery x 2.  He has chronic low back pain, but is manageable currently.  He has intermittent numbness of the feet as well.  Denies saddle anesthesia, urinary or stool incontinence. He takes THC gummies for pain relief, prefers to avoid opioid medicines.  Past Medical History:  Diagnosis Date   Anxiety 1996   Depression 1996   Enlarged prostate    but no meds required at present   GERD (gastroesophageal reflux disease)    takes Dexilant  daily   Hypertension    takes benicar  daily   Insomnia    Neuromuscular disorder (HCC) 1997   Sleep apnea 1996   Weakness    bilateral legs d/t back problems    Past Surgical History:  Procedure Laterality Date   BACK SURGERY     2010, Dr. Colon, L5-S1   CHOLECYSTECTOMY     COLONOSCOPY WITH PROPOFOL  N/A 03/12/2023   Procedure:  COLONOSCOPY WITH PROPOFOL ;  Surgeon: Eartha Angelia Sieving, MD;  Location: [facility];  Service: Gastroenterology;  Laterality: N/A;  9:30;asa 1   FINGER FRACTURE SURGERY Right    reattatched   HEMOSTASIS CLIP PLACEMENT  03/12/2023   Procedure: HEMOSTASIS CLIP PLACEMENT;  Surgeon: Eartha Angelia Sieving, MD;  Location: [facility];  Service: Gastroenterology;;   MASS EXCISION  06/23/2012   Procedure: EXCISION MASS;  Surgeon: Oneil DELENA Budge, MD;  Location: [facility];  Service: General;  Laterality: N/A;  Excision Chest Wall Mass   NASAL SINUS SURGERY     POLYPECTOMY  03/12/2023   Procedure: POLYPECTOMY;  Surgeon: Eartha Angelia Sieving, MD;  Location: [facility];  Service: Gastroenterology;;   SPINE SURGERY  1997   SUBMUCOSAL LIFTING INJECTION  03/12/2023   Procedure: SUBMUCOSAL LIFTING INJECTION;  Surgeon: Eartha Angelia Sieving, MD;  Location: [facility];  Service: Gastroenterology;;    Family History  Problem Relation Age of Onset   Cancer - Lung Father    Alcohol abuse Father    Anxiety disorder Mother    Depression Mother    Diabetes Mother    Kidney disease Mother    Stroke Mother    Cancer Brother    COPD Brother    Depression Brother    Stroke Brother     Social  History   Socioeconomic History   Marital status: Significant Other    Spouse name: Felecia   Number of children: 2   Years of education: 65   Highest education level: 12th grade  Occupational History    Comment: not employed  Tobacco Use   Smoking status: Former    Current packs/day: 1.50    Average packs/day: 1.6 packs/day for 18.8 years (30.1 ttl pk-yrs)    Types: Cigarettes, Cigars   Smokeless tobacco: Current   Tobacco comments:    quit 16 yrs ago, THC gummies  Vaping Use   Vaping status: Former  Substance and Sexual Activity   Alcohol use: Yes    Alcohol/week: 3.0 standard drinks of alcohol    Types: 3 Shots of liquor per week    Comment: weekend beers   Drug use: Not  Currently    Frequency: 3.0 times per week    Types: Other-see comments    Comment: CBD gummies   Sexual activity: Yes    Birth control/protection: None  Other Topics Concern   Not on file  Social History Narrative   Patient is right handed and consumes 2-3 sodas daily   Social Drivers of Health   Financial Resource Strain: High Risk (03/27/2024)   Overall Financial Resource Strain (CARDIA)    Difficulty of Paying Living Expenses: Hard  Food Insecurity: Food Insecurity Present (03/27/2024)   Hunger Vital Sign    Worried About Running Out of Food in the Last Year: Often true    Ran Out of Food in the Last Year: Often true  Transportation Needs: No Transportation Needs (03/27/2024)   PRAPARE - Administrator, Civil Service (Medical): No    Lack of Transportation (Non-Medical): No  Physical Activity: Sufficiently Active (03/27/2024)   Exercise Vital Sign    Days of Exercise per Week: 2 days    Minutes of Exercise per Session: 90 min  Stress: Stress Concern Present (03/27/2024)   [employer] of Occupational Health - Occupational Stress Questionnaire    Feeling of Stress: To some extent  Social Connections: Moderately Isolated (03/27/2024)   Social Connection and Isolation Panel    Frequency of Communication with Friends and Family: Three times a week    Frequency of Social Gatherings with Friends and Family: More than three times a week    Attends Religious Services: Never    Database Administrator or Organizations: No    Attends Engineer, Structural: Not on file    Marital Status: Living with partner  Intimate Partner Violence: Not At Risk (09/20/2023)   Humiliation, Afraid, Rape, and Kick questionnaire    Fear of Current or Ex-Partner: No    Emotionally Abused: No    Physically Abused: No    Sexually Abused: No    Outpatient Medications Prior to Visit  Medication Sig Dispense Refill   ALPRAZolam  (XANAX ) 1 MG tablet Take 1 tablet (1 mg total) by  mouth 3 (three) times daily as needed for sleep or anxiety. 90 tablet 1   amLODipine -olmesartan  (AZOR ) 10-40 MG tablet TAKE ONE TABLET BY MOUTH DAILY 90 tablet 1   DULoxetine  (CYMBALTA ) 30 MG capsule TAKE ONE CAPSULE BY MOUTH DAILY 90 capsule 1   folic acid  (FOLVITE ) 1 MG tablet Take 1 tablet (1 mg total) by mouth daily. 30 tablet 0   gabapentin  (NEURONTIN ) 400 MG capsule Take 1 capsule (400 mg total) by mouth at bedtime. 30 capsule 2   OVER THE COUNTER MEDICATION Diltiazem   mg unknown one bid.     sildenafil  (VIAGRA ) 100 MG tablet Take 1 tablet (100 mg total) by mouth as needed for erectile dysfunction. 30 tablet 1   No facility-administered medications prior to visit.    No Known Allergies  ROS Review of Systems  Constitutional:  Negative for chills and fever.  HENT:  Negative for congestion and sore throat.   Eyes:  Negative for pain and discharge.  Respiratory:  Negative for cough and shortness of breath.   Cardiovascular:  Negative for chest pain and palpitations.  Gastrointestinal:  Negative for diarrhea, nausea and vomiting.  Endocrine: Negative for polydipsia and polyuria.  Genitourinary:  Negative for dysuria and hematuria.  Musculoskeletal:  Positive for back pain. Negative for neck pain and neck stiffness.  Skin:  Negative for rash.  Neurological:  Negative for dizziness and weakness.  Psychiatric/Behavioral:  Negative for agitation and behavioral problems. The patient is nervous/anxious.       Objective:    Physical Exam Vitals reviewed.  Constitutional:      General: He is not in acute distress.    Appearance: He is not diaphoretic.  HENT:     Head: Normocephalic and atraumatic.     Nose: Nose normal.     Mouth/Throat:     Mouth: Mucous membranes are moist.  Eyes:     General: No scleral icterus.    Extraocular Movements: Extraocular movements intact.  Cardiovascular:     Rate and Rhythm: Normal rate and regular rhythm.     Heart sounds: Normal heart sounds.  No murmur heard. Pulmonary:     Breath sounds: Normal breath sounds. No wheezing or rales.  Musculoskeletal:     Cervical back: Neck supple. No tenderness.     Right lower leg: No edema.     Left lower leg: No edema.  Skin:    General: Skin is warm.     Findings: No rash.  Neurological:     General: No focal deficit present.     Mental Status: He is alert and oriented to person, place, and time.     Cranial Nerves: No cranial nerve deficit.     Sensory: No sensory deficit.     Motor: Weakness (B/l LE - 4/5) present.  Psychiatric:        Mood and Affect: Mood normal.        Behavior: Behavior normal.     BP 119/76   Pulse 71   Ht 6' (1.829 m)   Wt 206 lb 12.8 oz (93.8 kg)   SpO2 98%   BMI 28.05 kg/m  Wt Readings from Last 3 Encounters:  03/31/24 206 lb 12.8 oz (93.8 kg)  12/19/23 206 lb 3.2 oz (93.5 kg)  11/28/23 205 lb (93 kg)    Lab Results  Component Value Date   TSH 0.895 02/21/2023   Lab Results  Component Value Date   WBC 10.2 11/28/2023   HGB 17.0 11/28/2023   HCT 48.6 11/28/2023   MCV 95 11/28/2023   PLT 248 11/28/2023   Lab Results  Component Value Date   NA 143 11/28/2023   K 3.8 11/28/2023   CO2 24 11/28/2023   GLUCOSE 94 11/28/2023   BUN 8 11/28/2023   CREATININE 0.83 11/28/2023   BILITOT 0.7 11/28/2023   ALKPHOS 130 (H) 11/28/2023   AST 21 11/28/2023   ALT 24 11/28/2023   PROT 6.9 11/28/2023   ALBUMIN 4.4 11/28/2023   CALCIUM 9.7 11/28/2023   ANIONGAP 9 03/28/2017  EGFR 104 11/28/2023   Lab Results  Component Value Date   CHOL 176 02/21/2023   Lab Results  Component Value Date   HDL 59 02/21/2023   Lab Results  Component Value Date   LDLCALC 96 02/21/2023   Lab Results  Component Value Date   TRIG 116 02/21/2023   Lab Results  Component Value Date   CHOLHDL 3.0 02/21/2023   Lab Results  Component Value Date   HGBA1C 5.1 02/21/2023      Assessment & Plan:   Problem List Items Addressed This Visit        Cardiovascular and Mediastinum   Essential hypertension - Primary   BP Readings from Last 1 Encounters:  03/31/24 119/76   Well controlled with amlodipine -olmesartan  10-40 mg once daily Counseled for compliance with the medications Advised DASH diet and moderate exercise/walking, at least 150 mins/week      Relevant Orders   TSH   CMP14+EGFR   CBC with Differential/Platelet     Respiratory   OSA (obstructive sleep apnea)   Followed by sleep specialist - started CPAP treatment with nasal pillow, he has not tolerated full face mask in the past        Other   Spinal stenosis of lumbar region with neurogenic claudication   S/p lumbar spine surgery X 2 Used to take oral opioids On Gabapentin  400 mg at bedtime now      GAD (generalized anxiety disorder)   Well-controlled with Cymbalta  30 mg QD and Xanax  1 mg 3 times daily Advised to keep Xanax  dose to maximum 3 times daily PDMP reviewed Advised to avoid marijuana products      Prostate cancer screening   Ordered PSA after discussing its limitations for prostate cancer screening, including false positive results leading to additional investigations.      Relevant Orders   PSA   Other Visit Diagnoses       Chronic prescription benzodiazepine use       Relevant Orders   ToxASSURE Select 13 (MW), Urine     Mixed hyperlipidemia       Relevant Orders   Lipid panel     Hyperglycemia       Relevant Orders   Hemoglobin A1c   CMP14+EGFR     Vitamin D  deficiency       Relevant Orders   VITAMIN D  25 Hydroxy (Vit-D Deficiency, Fractures)     Encounter for immunization       Relevant Orders   Pneumococcal conjugate vaccine 20-valent (Completed)     Refused influenza vaccine              No orders of the defined types were placed in this encounter.   Follow-up: Return in about 4 months (around 08/01/2024) for HTN.    Suzzane MARLA Blanch, MD

## 2024-03-31 NOTE — Assessment & Plan Note (Addendum)
 Followed by sleep specialist - started CPAP treatment with nasal pillow, he has not tolerated full face mask in the past

## 2024-03-31 NOTE — Assessment & Plan Note (Addendum)
 S/p lumbar spine surgery X 2 Used to take oral opioids On Gabapentin  400 mg at bedtime now

## 2024-03-31 NOTE — Assessment & Plan Note (Signed)
 BP Readings from Last 1 Encounters:  03/31/24 119/76   Well controlled with amlodipine -olmesartan  10-40 mg once daily Counseled for compliance with the medications Advised DASH diet and moderate exercise/walking, at least 150 mins/week

## 2024-03-31 NOTE — Assessment & Plan Note (Signed)
 Well-controlled with Cymbalta  30 mg QD and Xanax  1 mg 3 times daily Advised to keep Xanax  dose to maximum 3 times daily PDMP reviewed Advised to avoid marijuana products

## 2024-03-31 NOTE — Assessment & Plan Note (Signed)
 Ordered PSA after discussing its limitations for prostate cancer screening, including false positive results leading to additional investigations.

## 2024-03-31 NOTE — Patient Instructions (Addendum)
 Please continue to take medications as prescribed.  Please continue to follow low salt diet and perform moderate exercise/walking at least 150 mins/week.  Please get fasting blood tests done before the next visit.  Please consider getting Tdap vaccine at local pharmacy.

## 2024-04-04 LAB — TOXASSURE SELECT 13 (MW), URINE

## 2024-04-10 ENCOUNTER — Other Ambulatory Visit: Payer: Self-pay | Admitting: Internal Medicine

## 2024-04-10 DIAGNOSIS — F411 Generalized anxiety disorder: Secondary | ICD-10-CM

## 2024-04-20 ENCOUNTER — Other Ambulatory Visit: Payer: Self-pay | Admitting: Internal Medicine

## 2024-05-10 ENCOUNTER — Encounter: Payer: Self-pay | Admitting: Internal Medicine

## 2024-05-11 ENCOUNTER — Other Ambulatory Visit: Payer: Self-pay | Admitting: Internal Medicine

## 2024-05-11 DIAGNOSIS — L304 Erythema intertrigo: Secondary | ICD-10-CM

## 2024-05-11 MED ORDER — CLOTRIMAZOLE-BETAMETHASONE 1-0.05 % EX CREA: 1.0000 | TOPICAL_CREAM | Freq: Every day | CUTANEOUS | 0 refills | Status: AC

## 2024-05-12 NOTE — Telephone Encounter (Signed)
 Please advise

## 2024-05-17 ENCOUNTER — Other Ambulatory Visit: Payer: Self-pay | Admitting: Internal Medicine

## 2024-05-22 NOTE — Telephone Encounter (Signed)
 I am ok with you discontinuing if you are not tolerating and sleeping better without it. You can return to medical supply store that you received it from. Maintain normal BMI, avoid sleeping flat on your back (side sleeping position is preferred or try wedge pillow), caution alcohol use before bedtime and do not drive if tired.

## 2024-05-23 ENCOUNTER — Other Ambulatory Visit: Payer: Self-pay | Admitting: Internal Medicine

## 2024-05-23 DIAGNOSIS — I10 Essential (primary) hypertension: Secondary | ICD-10-CM

## 2024-06-06 ENCOUNTER — Other Ambulatory Visit: Payer: Self-pay | Admitting: Primary Care

## 2024-06-18 ENCOUNTER — Other Ambulatory Visit: Payer: Self-pay | Admitting: Internal Medicine

## 2024-07-08 ENCOUNTER — Other Ambulatory Visit: Payer: Self-pay | Admitting: Internal Medicine

## 2024-07-08 DIAGNOSIS — F411 Generalized anxiety disorder: Secondary | ICD-10-CM

## 2024-07-28 ENCOUNTER — Ambulatory Visit: Admitting: Internal Medicine

## 2024-09-21 ENCOUNTER — Ambulatory Visit
